# Patient Record
Sex: Male | Born: 1940 | Race: Black or African American | Hispanic: No | Marital: Married | Smoking: Never smoker
Health system: Southern US, Community
[De-identification: ages and names within clinical notes are randomized; demographics above are authoritative.]

## PROBLEM LIST (undated history)

## (undated) DIAGNOSIS — I10 Essential (primary) hypertension: Secondary | ICD-10-CM

## (undated) DIAGNOSIS — M1990 Unspecified osteoarthritis, unspecified site: Secondary | ICD-10-CM

## (undated) DIAGNOSIS — R350 Frequency of micturition: Secondary | ICD-10-CM

## (undated) DIAGNOSIS — C50912 Malignant neoplasm of unspecified site of left female breast: Secondary | ICD-10-CM

## (undated) DIAGNOSIS — I82419 Acute embolism and thrombosis of unspecified femoral vein: Secondary | ICD-10-CM

## (undated) DIAGNOSIS — C50911 Malignant neoplasm of unspecified site of right female breast: Secondary | ICD-10-CM

## (undated) DIAGNOSIS — B1910 Unspecified viral hepatitis B without hepatic coma: Secondary | ICD-10-CM

## (undated) HISTORY — PX: TONSILLECTOMY: SUR1361

## (undated) HISTORY — PX: VARICOSE VEIN SURGERY: SHX832

## (undated) HISTORY — PX: JOINT REPLACEMENT: SHX530

## (undated) HISTORY — PX: TOTAL KNEE ARTHROPLASTY: SHX125

## (undated) HISTORY — PX: TOTAL HIP ARTHROPLASTY: SHX124

## (undated) HISTORY — PX: BACK SURGERY: SHX140

---

## 2004-01-25 DIAGNOSIS — C50911 Malignant neoplasm of unspecified site of right female breast: Secondary | ICD-10-CM

## 2004-01-25 HISTORY — PX: BREAST BIOPSY: SHX20

## 2004-01-25 HISTORY — DX: Malignant neoplasm of unspecified site of right female breast: C50.911

## 2004-01-25 HISTORY — PX: MASTECTOMY: SHX3

## 2013-02-25 ENCOUNTER — Emergency Department (HOSPITAL_COMMUNITY)
Admission: EM | Admit: 2013-02-25 | Discharge: 2013-02-25 | Disposition: A | Discharge status: Home / Self Care | Payer: Medicare Other | Attending: Emergency Medicine | Admitting: Emergency Medicine

## 2013-02-25 ENCOUNTER — Encounter (HOSPITAL_COMMUNITY): Payer: Self-pay | Admitting: Emergency Medicine

## 2013-02-25 ENCOUNTER — Emergency Department (HOSPITAL_COMMUNITY): Payer: Medicare Other

## 2013-02-25 DIAGNOSIS — Z79899 Other long term (current) drug therapy: Secondary | ICD-10-CM | POA: Insufficient documentation

## 2013-02-25 DIAGNOSIS — I10 Essential (primary) hypertension: Secondary | ICD-10-CM | POA: Insufficient documentation

## 2013-02-25 DIAGNOSIS — M19079 Primary osteoarthritis, unspecified ankle and foot: Secondary | ICD-10-CM | POA: Insufficient documentation

## 2013-02-25 DIAGNOSIS — M1990 Unspecified osteoarthritis, unspecified site: Secondary | ICD-10-CM

## 2013-02-25 DIAGNOSIS — Z7982 Long term (current) use of aspirin: Secondary | ICD-10-CM | POA: Insufficient documentation

## 2013-02-25 HISTORY — DX: Essential (primary) hypertension: I10

## 2013-02-25 MED ORDER — DICLOFENAC SODIUM 50 MG PO TBEC: 50.0000 mg | DELAYED_RELEASE_TABLET | Freq: Two times a day (BID) | ORAL | Status: DC

## 2013-02-25 MED ORDER — DICLOFENAC SODIUM 50 MG PO TBEC
50.0000 mg | DELAYED_RELEASE_TABLET | Freq: Once | ORAL | Status: AC
  Administered 2013-02-25: 50 mg via ORAL
  Filled 2013-02-25: qty 1

## 2013-02-25 MED ORDER — HYDROCODONE-ACETAMINOPHEN 5-325 MG PO TABS: 1.0000 | ORAL_TABLET | Freq: Four times a day (QID) | ORAL | Status: DC | PRN

## 2013-02-25 NOTE — ED Provider Notes (Signed)
CSN: [id number]     Arrival date & time 02/25/13  1112 History   First MD Initiated Contact with Patient 02/25/13 1119    This chart was scribed for Earney Navy, a non-physician practitioner working with No att. providers found by Denice Bors, ED Scribe. This patient was seen in room TR07C/TR07C and the patient's care was started at 12:30 PM      Chief Complaint  Patient presents with  . Ankle Pain   (Consider location/radiation/quality/duration/timing/severity/associated sxs/prior Treatment) The history is provided by the patient. No language interpreter was used.   HPI Comments Angel Fox is a 73 y.o. male who presents to the Emergency Department complaining of constant worsening right ankle pain onset 3 weeks. Describes pain as sharp sensation. Reports pain is exacerbated with weight bearing. Reports pain is mildly alleviated at rest. Denies associated recent injury, numbness, weakness, and fever. Denies PMHx of gout.   Past Medical History  Diagnosis Date  . Hypertension    Past Surgical History  Procedure Laterality Date  . Joint replacement     No family history on file. History  Substance Use Topics  . Smoking status: Never Smoker   . Smokeless tobacco: Not on file  . Alcohol Use: No    Review of Systems  Constitutional: Negative for fever.  Musculoskeletal: Positive for arthralgias.  Psychiatric/Behavioral: Negative for confusion.    Allergies  Codeine  Home Medications   Current Outpatient Rx  Name  Route  Sig  Dispense  Refill  . aspirin EC 81 MG tablet   Oral   Take 81 mg by mouth daily.         Marland Kitchen atorvastatin (LIPITOR) 40 MG tablet   Oral   Take 40 mg by mouth daily.         . hydrochlorothiazide (HYDRODIURIL) 25 MG tablet   Oral   Take 25 mg by mouth daily.         Marland Kitchen ibuprofen (ADVIL,MOTRIN) 200 MG tablet   Oral   Take 600 mg by mouth every 6 (six) hours as needed (pain).         . Multiple Vitamin (MULTIVITAMIN WITH  MINERALS) TABS tablet   Oral   Take 1 tablet by mouth daily.         . potassium chloride SA (K-DUR,KLOR-CON) 20 MEQ tablet   Oral   Take 20 mEq by mouth daily.         . propranolol (INDERAL) 40 MG tablet   Oral   Take 40 mg by mouth every morning.         . diclofenac (VOLTAREN) 50 MG EC tablet   Oral   Take 1 tablet (50 mg total) by mouth 2 (two) times daily.   30 tablet   0   . HYDROcodone-acetaminophen (NORCO/VICODIN) 5-325 MG per tablet   Oral   Take 1 tablet by mouth every 6 (six) hours as needed.   20 tablet   0    BP 136/75  Pulse 58  Temp(Src) 98.4 F (36.9 C) (Oral)  Resp 18  SpO2 99% Physical Exam  Nursing note and vitals reviewed. Constitutional: He is oriented to person, place, and time. He appears well-developed and well-nourished. No distress.  HENT:  Head: Normocephalic and atraumatic.  Eyes: EOM are normal.  Neck: Neck supple. No tracheal deviation present.  Cardiovascular: Normal rate.   Pulmonary/Chest: Effort normal. No respiratory distress.  Musculoskeletal: Normal range of motion.       Right  ankle: Tenderness. Lateral malleolus tenderness found. Achilles tendon normal.  Right ankle:  No pitting edema, no erythema, and no ecchymosis  Strong DP and PT pulses.   Normal sensation and cap refill  No pain with ROM of right ankle   Neurological: He is alert and oriented to person, place, and time.  Skin: Skin is warm and dry.  Psychiatric: He has a normal mood and affect. His behavior is normal.    ED Course  Procedures   COORDINATION OF CARE:  Nursing notes reviewed. Vital signs reviewed. Initial pt interview and examination performed.   12:33 PM-Discussed work up plan with pt at bedside, which includes x-ray of right ankle. Pt agrees with plan.  12:38 PM Nursing Notes Reviewed/ Care Coordinated Applicable Imaging Reviewed and incorporated into ED treatment Discussed results and treatment plan with pt. Pt demonstrates  understanding and agrees with plan.  Treatment plan initiated: Medications  diclofenac (VOLTAREN) EC tablet 50 mg (50 mg Oral Given 02/25/13 1318)     Initial diagnostic testing ordered.     Labs Review Labs Reviewed - No data to display Imaging Review Dg Ankle Complete Right  02/25/2013   CLINICAL DATA:  Ankle pain.  No known injury.  EXAM: RIGHT ANKLE - COMPLETE 3+ VIEW  COMPARISON:  None.  FINDINGS: No fracture or bone lesion. The ankle mortise is normally space and aligned with no arthropathic changes evident.  There is a small plantar calcaneal spur.  Bones are demineralized. There is diffuse mild soft tissue swelling.  IMPRESSION: No fracture, bone lesion or ankle joint abnormality.   Electronically Signed   By: Lajean Manes M.D.   On: 02/25/2013 12:01    EKG Interpretation   None       MDM   1. Arthritis    73 y.o.Angel Fox evaluation in the Emergency Department is complete. It has been determined that no acute conditions requiring further emergency intervention are present at this time. The patient/guardian have been advised of the diagnosis and plan. We have discussed signs and symptoms that warrant return to the ED, such as changes or worsening in symptoms.  Vital signs are stable at discharge. Filed Vitals:   02/25/13 1326  BP: 136/75  Pulse: 58  Temp:   Resp: 18    Patient/guardian has voiced understanding and agreed to follow-up with the PCP or specialist.  I personally performed the services described in this documentation, which was scribed in my presence. The recorded information has been reviewed and is accurate.    Linus Mako, PA-C 02/25/13 1431

## 2013-02-25 NOTE — ED Notes (Signed)
Pt reports right ankle pain x 3 weeks, denies known injury.

## 2013-02-25 NOTE — ED Provider Notes (Signed)
Medical screening examination/treatment/procedure(s) were performed by non-physician practitioner and as supervising physician I was immediately available for consultation/collaboration.     Veryl Speak, MD 02/25/13 [phone]

## 2013-02-25 NOTE — ED Notes (Signed)
Message to pharmacy for medicine.

## 2013-02-25 NOTE — Discharge Instructions (Signed)

## 2013-03-18 ENCOUNTER — Emergency Department (HOSPITAL_COMMUNITY)
Admission: EM | Admit: 2013-03-18 | Discharge: 2013-03-18 | Disposition: A | Discharge status: Home / Self Care | Payer: Medicare Other | Attending: Emergency Medicine | Admitting: Emergency Medicine

## 2013-03-18 ENCOUNTER — Encounter (HOSPITAL_COMMUNITY): Payer: Self-pay | Admitting: Emergency Medicine

## 2013-03-18 DIAGNOSIS — M25579 Pain in unspecified ankle and joints of unspecified foot: Secondary | ICD-10-CM

## 2013-03-18 DIAGNOSIS — I10 Essential (primary) hypertension: Secondary | ICD-10-CM | POA: Insufficient documentation

## 2013-03-18 DIAGNOSIS — Z79899 Other long term (current) drug therapy: Secondary | ICD-10-CM | POA: Insufficient documentation

## 2013-03-18 DIAGNOSIS — M25569 Pain in unspecified knee: Secondary | ICD-10-CM | POA: Insufficient documentation

## 2013-03-18 DIAGNOSIS — Z7982 Long term (current) use of aspirin: Secondary | ICD-10-CM | POA: Insufficient documentation

## 2013-03-18 DIAGNOSIS — IMO0001 Reserved for inherently not codable concepts without codable children: Secondary | ICD-10-CM | POA: Insufficient documentation

## 2013-03-18 MED ORDER — HYDROCODONE-ACETAMINOPHEN 5-325 MG PO TABS: 1.0000 | ORAL_TABLET | Freq: Four times a day (QID) | ORAL | Status: DC | PRN

## 2013-03-18 NOTE — ED Provider Notes (Signed)
Medical screening examination/treatment/procedure(s) were performed by non-physician practitioner and as supervising physician I was immediately available for consultation/collaboration.  EKG Interpretation   None         Maudry Diego, MD 03/18/13 [phone]

## 2013-03-18 NOTE — ED Provider Notes (Signed)
CSN: [id number]     Arrival date & time 03/18/13  0927 History  This chart was scribed for non-physician practitioner, Montine Circle, PA-C working with Maudry Diego, MD by Frederich Balding, ED scribe. This patient was seen in room TR06C/TR06C and the patient's care was started at 10:02 AM.   Chief Complaint  Patient presents with  . Leg Pain   The history is provided by the patient. No language interpreter was used.   HPI Comments: Edras Wilford is a 73 y.o. male who presents to the Emergency Department complaining of gradual onset, constant right ankle pain that started 6 weeks ago. Denies fall or injury. Pt was seen for the same one week ago but can't get an appointment at [facility], [facility], [facility] until next month. Pain is worse with ambulation. Pt states his buttocks also hurts when he sits down. He is requesting refills on the medications he was given last time he was here. Pt has a history of right knee replacement.   Past Medical History  Diagnosis Date  . Hypertension    Past Surgical History  Procedure Laterality Date  . Joint replacement     No family history on file. History  Substance Use Topics  . Smoking status: Never Smoker   . Smokeless tobacco: Not on file  . Alcohol Use: No    Review of Systems  Constitutional: Negative for fever.  HENT: Negative for congestion.   Eyes: Negative for redness.  Respiratory: Negative for shortness of breath.   Musculoskeletal: Positive for arthralgias and myalgias.  Skin: Negative for wound.  Neurological: Negative for speech difficulty.  Psychiatric/Behavioral: Negative for confusion.   Allergies  Codeine  Home Medications   Current Outpatient Rx  Name  Route  Sig  Dispense  Refill  . aspirin EC 81 MG tablet   Oral   Take 81 mg by mouth daily.         Marland Kitchen atorvastatin (LIPITOR) 40 MG tablet   Oral   Take 40 mg by mouth daily.         . diclofenac (VOLTAREN) 50 MG EC tablet   Oral   Take 1 tablet (50 mg total) by mouth 2  (two) times daily.   30 tablet   0   . hydrochlorothiazide (HYDRODIURIL) 25 MG tablet   Oral   Take 25 mg by mouth daily.         Marland Kitchen HYDROcodone-acetaminophen (NORCO/VICODIN) 5-325 MG per tablet   Oral   Take 1 tablet by mouth every 6 (six) hours as needed.   20 tablet   0   . ibuprofen (ADVIL,MOTRIN) 200 MG tablet   Oral   Take 600 mg by mouth every 6 (six) hours as needed (pain).         . Multiple Vitamin (MULTIVITAMIN WITH MINERALS) TABS tablet   Oral   Take 1 tablet by mouth daily.         . potassium chloride SA (K-DUR,KLOR-CON) 20 MEQ tablet   Oral   Take 20 mEq by mouth daily.         . propranolol (INDERAL) 40 MG tablet   Oral   Take 40 mg by mouth every morning.          BP 160/91  Pulse 66  Temp(Src) 97.9 F (36.6 C) (Oral)  Resp 18  Ht 5\' 10"  (1.778 m)  Wt 235 lb (106.595 kg)  BMI 33.72 kg/m2  SpO2 100%  Physical Exam  Nursing note and vitals  reviewed. Constitutional: He is oriented to person, place, and time. He appears well-developed. No distress.  HENT:  Head: Normocephalic and atraumatic.  Eyes: Conjunctivae and EOM are normal.  Cardiovascular: Normal rate, regular rhythm and intact distal pulses.   Brisk capillary refill.   Pulmonary/Chest: Effort normal. No stridor. No respiratory distress.  Abdominal: He exhibits no distension.  Musculoskeletal: He exhibits no edema.  Right ankle tender to palpation over the ATFL. No bony abnormality or deformity. No swelling. ROM and strength 5/5. No evidence of DVT or septic joint. No unilateral leg swelling.   Neurological: He is alert and oriented to person, place, and time.  Sensation intact.   Skin: Skin is warm and dry.  No erythema or signs of cellulitis. No wounds or ulcers.   Psychiatric: He has a normal mood and affect.    ED Course  Procedures (including critical care time)  DIAGNOSTIC STUDIES: Oxygen Saturation is 100% on RA, normal by my interpretation.    COORDINATION OF  CARE: 10:06 AM-Discussed treatment plan which includes an ankle brace and a short course of pain medication with pt at bedside and pt agreed to plan.   Labs Review Labs Reviewed - No data to display Imaging Review No results found.  EKG Interpretation   None       MDM   Final diagnoses:  Ankle pain    Patient with chronic ankle pain. He was seen 6 weeks ago for the same. Plain films were negative. He denies any mechanism of injury. No evidence of DVT, cellulitis, or septic joint. I will prescribe Norco, and recommend orthopedic/PCP followup. I will also give ankle brace.  I personally performed the services described in this documentation, which was scribed in my presence. The recorded information has been reviewed and is accurate.    Montine Circle, PA-C 03/18/13 1014

## 2013-03-18 NOTE — ED Notes (Signed)
Patient states he has been having R leg pain x 6 weeks.  Patient was just here last week for same, but states he could not get appointment until next month at [facility].

## 2013-03-18 NOTE — Discharge Instructions (Signed)
Arthralgia  Your caregiver has diagnosed you as suffering from an arthralgia. Arthralgia means there is pain in a joint. This can come from many reasons including:  · Bruising the joint which causes soreness (inflammation) in the joint.  · Wear and tear on the joints which occur as we grow older (osteoarthritis).  · Overusing the joint.  · Various forms of arthritis.  · Infections of the joint.  Regardless of the cause of pain in your joint, most of these different pains respond to anti-inflammatory drugs and rest. The exception to this is when a joint is infected, and these cases are treated with antibiotics, if it is a bacterial infection.  HOME CARE INSTRUCTIONS   · Rest the injured area for as long as directed by your caregiver. Then slowly start using the joint as directed by your caregiver and as the pain allows. Crutches as directed may be useful if the ankles, knees or hips are involved. If the knee was splinted or casted, continue use and care as directed. If an stretchy or elastic wrapping bandage has been applied today, it should be removed and re-applied every 3 to 4 hours. It should not be applied tightly, but firmly enough to keep swelling down. Watch toes and feet for swelling, bluish discoloration, coldness, numbness or excessive pain. If any of these problems (symptoms) occur, remove the ace bandage and re-apply more loosely. If these symptoms persist, contact your caregiver or return to this location.  · For the first 24 hours, keep the injured extremity elevated on pillows while lying down.  · Apply ice for 15-20 minutes to the sore joint every couple hours while awake for the first half day. Then 03-04 times per day for the first 48 hours. Put the ice in a plastic bag and place a towel between the bag of ice and your skin.  · Wear any splinting, casting, elastic bandage applications, or slings as instructed.  · Only take over-the-counter or prescription medicines for pain, discomfort, or fever as  directed by your caregiver. Do not use aspirin immediately after the injury unless instructed by your physician. Aspirin can cause increased bleeding and bruising of the tissues.  · If you were given crutches, continue to use them as instructed and do not resume weight bearing on the sore joint until instructed.  Persistent pain and inability to use the sore joint as directed for more than 2 to 3 days are warning signs indicating that you should see a caregiver for a follow-up visit as soon as possible. Initially, a hairline fracture (break in bone) may not be evident on X-rays. Persistent pain and swelling indicate that further evaluation, non-weight bearing or use of the joint (use of crutches or slings as instructed), or further X-rays are indicated. X-rays may sometimes not show a small fracture until a week or 10 days later. Make a follow-up appointment with your own caregiver or one to whom we have referred you. A radiologist (specialist in reading X-rays) may read your X-rays. Make sure you know how you are to obtain your X-ray results. Do not assume everything is normal if you do not hear from us.  SEEK MEDICAL CARE IF:  Bruising, swelling, or pain increases.  SEEK IMMEDIATE MEDICAL CARE IF:   · Your fingers or toes are numb or blue.  · The pain is not responding to medications and continues to stay the same or get worse.  · The pain in your joint becomes severe.  · You   develop a fever over 102° F (38.9° C).  · It becomes impossible to move or use the joint.  MAKE SURE YOU:   · Understand these instructions.  · Will watch your condition.  · Will get help right away if you are not doing well or get worse.  Document Released: 01/10/2005 Document Revised: 04/04/2011 Document Reviewed: 08/29/2007  [employer]® Patient Information ©2014 [employer].

## 2014-08-04 ENCOUNTER — Other Ambulatory Visit: Payer: Self-pay | Admitting: Neurosurgery

## 2014-08-12 ENCOUNTER — Encounter (HOSPITAL_COMMUNITY)
Admission: RE | Admit: 2014-08-12 | Discharge: 2014-08-12 | Disposition: A | Discharge status: Home / Self Care | Payer: Medicare Other | Source: Ambulatory Visit | Attending: Neurosurgery | Admitting: Neurosurgery

## 2014-08-12 ENCOUNTER — Encounter (HOSPITAL_COMMUNITY): Payer: Self-pay

## 2014-08-12 DIAGNOSIS — M5126 Other intervertebral disc displacement, lumbar region: Secondary | ICD-10-CM | POA: Diagnosis not present

## 2014-08-12 DIAGNOSIS — Z01812 Encounter for preprocedural laboratory examination: Secondary | ICD-10-CM | POA: Insufficient documentation

## 2014-08-12 DIAGNOSIS — R001 Bradycardia, unspecified: Secondary | ICD-10-CM | POA: Insufficient documentation

## 2014-08-12 DIAGNOSIS — Z01818 Encounter for other preprocedural examination: Secondary | ICD-10-CM | POA: Diagnosis present

## 2014-08-12 DIAGNOSIS — I440 Atrioventricular block, first degree: Secondary | ICD-10-CM | POA: Insufficient documentation

## 2014-08-12 HISTORY — DX: Frequency of micturition: R35.0

## 2014-08-12 LAB — CBC
HEMATOCRIT: 40 % (ref 39.0–52.0)
HEMOGLOBIN: 13.6 g/dL (ref 13.0–17.0)
MCH: 30.2 pg (ref 26.0–34.0)
MCHC: 34 g/dL (ref 30.0–36.0)
MCV: 88.9 fL (ref 78.0–100.0)
Platelets: 144 10*3/uL — ABNORMAL LOW (ref 150–400)
RBC: 4.5 MIL/uL (ref 4.22–5.81)
RDW: 14.8 % (ref 11.5–15.5)
WBC: 7.5 10*3/uL (ref 4.0–10.5)

## 2014-08-12 LAB — COMPREHENSIVE METABOLIC PANEL
ALK PHOS: 87 U/L (ref 38–126)
ALT: 24 U/L (ref 17–63)
ANION GAP: 6 (ref 5–15)
AST: 30 U/L (ref 15–41)
Albumin: 3.9 g/dL (ref 3.5–5.0)
BILIRUBIN TOTAL: 0.7 mg/dL (ref 0.3–1.2)
BUN: 12 mg/dL (ref 6–20)
CHLORIDE: 106 mmol/L (ref 101–111)
CO2: 24 mmol/L (ref 22–32)
Calcium: 9.2 mg/dL (ref 8.9–10.3)
Creatinine, Ser: 1.19 mg/dL (ref 0.61–1.24)
GFR calc non Af Amer: 58 mL/min — ABNORMAL LOW (ref >60)
Glucose, Bld: 91 mg/dL (ref 65–99)
Potassium: 4.2 mmol/L (ref 3.5–5.1)
Sodium: 136 mmol/L (ref 135–145)
TOTAL PROTEIN: 6.9 g/dL (ref 6.5–8.1)

## 2014-08-12 LAB — SURGICAL PCR SCREEN
MRSA, PCR: NEGATIVE
Staphylococcus aureus: NEGATIVE

## 2014-08-12 NOTE — Pre-Procedure Instructions (Signed)
    [name]  08/12/2014      [address] [place] [address] Phone: [phone] Fax: [phone]  [id number] [address], [address] [place] [address] Phone: [phone] Fax: [phone]    Your procedure is scheduled on 08-18-2014  Monday   Report to [facility] Admitting at 5:30 A.M.   Call this number if you have problems the morning of surgery:  [phone]   Remember:  Do not eat food or drink liquids after midnight.   Take these medicines the morning of surgery with A SIP OF WATER potassium chloride(K-Dur),Propranolol(Inderal),Tamoxifen( Nolvadex)   Do not wear jewelry,   Do not wear lotions, powders, or perfumes.    Do not shave 48 hours prior to surgery.  Men may shave face and neck .  Do not bring valuables to the hospital.  [facility] is not responsible for any belongings or valuables.  Contacts, dentures or bridgework may not be worn into surgery.  Leave your suitcase in the car.  After surgery it may be brought to your room.  For patients admitted to the hospital, discharge time will be determined by your treatment team.     Special instructions:  See Attached sheet "Preparing for Surgery" for instructions on CHG shower    Please read over the following fact sheets that you were given. Pain Booklet and Surgical Site Infection Prevention

## 2014-08-12 NOTE — Progress Notes (Signed)
Pt. Denies any cardiac testing.

## 2014-08-18 ENCOUNTER — Encounter (HOSPITAL_COMMUNITY)
Admission: RE | Disposition: A | Discharge status: Home / Self Care | Payer: Self-pay | Source: Ambulatory Visit | Attending: Neurosurgery

## 2014-08-18 ENCOUNTER — Ambulatory Visit (HOSPITAL_COMMUNITY): Payer: Medicare Other | Admitting: Certified Registered Nurse Anesthetist

## 2014-08-18 ENCOUNTER — Inpatient Hospital Stay (HOSPITAL_COMMUNITY): Payer: Medicare Other

## 2014-08-18 ENCOUNTER — Encounter (HOSPITAL_COMMUNITY): Payer: Self-pay | Admitting: General Practice

## 2014-08-18 ENCOUNTER — Inpatient Hospital Stay (HOSPITAL_COMMUNITY)
Admission: RE | Admit: 2014-08-18 | Discharge: 2014-08-19 | DRG: 520 | Disposition: A | Discharge status: Home / Self Care | Payer: Medicare Other | Source: Ambulatory Visit | Attending: Neurosurgery | Admitting: Neurosurgery

## 2014-08-18 ENCOUNTER — Ambulatory Visit (HOSPITAL_COMMUNITY): Payer: Medicare Other

## 2014-08-18 DIAGNOSIS — Z79899 Other long term (current) drug therapy: Secondary | ICD-10-CM

## 2014-08-18 DIAGNOSIS — M5126 Other intervertebral disc displacement, lumbar region: Principal | ICD-10-CM | POA: Diagnosis present

## 2014-08-18 DIAGNOSIS — Z6834 Body mass index (BMI) 34.0-34.9, adult: Secondary | ICD-10-CM

## 2014-08-18 DIAGNOSIS — Z452 Encounter for adjustment and management of vascular access device: Secondary | ICD-10-CM

## 2014-08-18 DIAGNOSIS — Z7982 Long term (current) use of aspirin: Secondary | ICD-10-CM | POA: Diagnosis not present

## 2014-08-18 DIAGNOSIS — E6609 Other obesity due to excess calories: Secondary | ICD-10-CM | POA: Diagnosis present

## 2014-08-18 DIAGNOSIS — I10 Essential (primary) hypertension: Secondary | ICD-10-CM | POA: Diagnosis present

## 2014-08-18 DIAGNOSIS — M4806 Spinal stenosis, lumbar region: Secondary | ICD-10-CM | POA: Diagnosis present

## 2014-08-18 DIAGNOSIS — Z419 Encounter for procedure for purposes other than remedying health state, unspecified: Secondary | ICD-10-CM

## 2014-08-18 HISTORY — PX: LUMBAR LAMINECTOMY/DECOMPRESSION MICRODISCECTOMY: SHX5026

## 2014-08-18 SURGERY — LUMBAR LAMINECTOMY/DECOMPRESSION MICRODISCECTOMY 2 LEVELS
Anesthesia: General | Site: Spine Lumbar | Laterality: Right

## 2014-08-18 MED ORDER — ASPIRIN EC 81 MG PO TBEC
81.0000 mg | DELAYED_RELEASE_TABLET | Freq: Every day | ORAL | Status: DC
  Administered 2014-08-18 – 2014-08-19 (×2): 81 mg via ORAL
  Filled 2014-08-18 (×2): qty 1

## 2014-08-18 MED ORDER — MIDAZOLAM HCL 5 MG/5ML IJ SOLN
INTRAMUSCULAR | Status: DC | PRN
  Administered 2014-08-18 (×2): 1 mg via INTRAVENOUS

## 2014-08-18 MED ORDER — STERILE WATER FOR INJECTION IJ SOLN
INTRAMUSCULAR | Status: AC
  Filled 2014-08-18: qty 20

## 2014-08-18 MED ORDER — ONDANSETRON HCL 4 MG/2ML IJ SOLN
4.0000 mg | INTRAMUSCULAR | Status: DC | PRN
  Administered 2014-08-18: 4 mg via INTRAVENOUS
  Filled 2014-08-18: qty 2

## 2014-08-18 MED ORDER — ACETAMINOPHEN 325 MG PO TABS: 650.0000 mg | ORAL_TABLET | ORAL | Status: DC | PRN

## 2014-08-18 MED ORDER — SODIUM CHLORIDE 0.9 % IJ SOLN: 3.0000 mL | INTRAMUSCULAR | Status: DC | PRN

## 2014-08-18 MED ORDER — PROPRANOLOL HCL 40 MG PO TABS
40.0000 mg | ORAL_TABLET | Freq: Every morning | ORAL | Status: DC
  Administered 2014-08-19: 40 mg via ORAL
  Filled 2014-08-18: qty 1

## 2014-08-18 MED ORDER — VECURONIUM BROMIDE 10 MG IV SOLR
INTRAVENOUS | Status: AC
  Filled 2014-08-18: qty 10

## 2014-08-18 MED ORDER — MENTHOL 3 MG MT LOZG
1.0000 | LOZENGE | OROMUCOSAL | Status: DC | PRN
  Filled 2014-08-18: qty 9

## 2014-08-18 MED ORDER — TRAMADOL HCL 50 MG PO TABS
50.0000 mg | ORAL_TABLET | Freq: Four times a day (QID) | ORAL | Status: DC | PRN
  Administered 2014-08-18 – 2014-08-19 (×4): 50 mg via ORAL
  Filled 2014-08-18 (×5): qty 1

## 2014-08-18 MED ORDER — EPHEDRINE SULFATE 50 MG/ML IJ SOLN
INTRAMUSCULAR | Status: DC | PRN
  Administered 2014-08-18 (×2): 5 mg via INTRAVENOUS

## 2014-08-18 MED ORDER — ROCURONIUM BROMIDE 100 MG/10ML IV SOLN
INTRAVENOUS | Status: DC | PRN
  Administered 2014-08-18: 50 mg via INTRAVENOUS

## 2014-08-18 MED ORDER — ONDANSETRON HCL 4 MG/2ML IJ SOLN
INTRAMUSCULAR | Status: DC | PRN
  Administered 2014-08-18: 4 mg via INTRAVENOUS

## 2014-08-18 MED ORDER — SUGAMMADEX SODIUM 200 MG/2ML IV SOLN
INTRAVENOUS | Status: AC
  Filled 2014-08-18: qty 2

## 2014-08-18 MED ORDER — NEOSTIGMINE METHYLSULFATE 10 MG/10ML IV SOLN
INTRAVENOUS | Status: DC | PRN
  Administered 2014-08-18: 2 mg via INTRAVENOUS

## 2014-08-18 MED ORDER — HYDROCHLOROTHIAZIDE 25 MG PO TABS
25.0000 mg | ORAL_TABLET | Freq: Every day | ORAL | Status: DC
  Administered 2014-08-18 – 2014-08-19 (×2): 25 mg via ORAL
  Filled 2014-08-18 (×2): qty 1

## 2014-08-18 MED ORDER — PROPOFOL 10 MG/ML IV BOLUS
INTRAVENOUS | Status: DC | PRN
  Administered 2014-08-18: 150 mg via INTRAVENOUS

## 2014-08-18 MED ORDER — FENTANYL CITRATE (PF) 250 MCG/5ML IJ SOLN
INTRAMUSCULAR | Status: AC
  Filled 2014-08-18: qty 5

## 2014-08-18 MED ORDER — SENNOSIDES-DOCUSATE SODIUM 8.6-50 MG PO TABS
1.0000 | ORAL_TABLET | Freq: Every evening | ORAL | Status: DC | PRN
  Filled 2014-08-18: qty 1

## 2014-08-18 MED ORDER — HEMOSTATIC AGENTS (NO CHARGE) OPTIME
TOPICAL | Status: DC | PRN
  Administered 2014-08-18: 1 via TOPICAL

## 2014-08-18 MED ORDER — BISACODYL 5 MG PO TBEC
5.0000 mg | DELAYED_RELEASE_TABLET | Freq: Every day | ORAL | Status: DC | PRN
  Filled 2014-08-18: qty 1

## 2014-08-18 MED ORDER — ADULT MULTIVITAMIN W/MINERALS CH
1.0000 | ORAL_TABLET | Freq: Every day | ORAL | Status: DC
  Administered 2014-08-18 – 2014-08-19 (×2): 1 via ORAL
  Filled 2014-08-18 (×2): qty 1

## 2014-08-18 MED ORDER — VECURONIUM BROMIDE 10 MG IV SOLR
INTRAVENOUS | Status: DC | PRN
  Administered 2014-08-18: 2 mg via INTRAVENOUS

## 2014-08-18 MED ORDER — 0.9 % SODIUM CHLORIDE (POUR BTL) OPTIME
TOPICAL | Status: DC | PRN
  Administered 2014-08-18: 1000 mL

## 2014-08-18 MED ORDER — LIDOCAINE HCL (CARDIAC) 20 MG/ML IV SOLN
INTRAVENOUS | Status: AC
  Filled 2014-08-18: qty 5

## 2014-08-18 MED ORDER — LACTATED RINGERS IV SOLN
INTRAVENOUS | Status: DC | PRN
  Administered 2014-08-18 (×2): via INTRAVENOUS

## 2014-08-18 MED ORDER — LIDOCAINE-EPINEPHRINE 0.5 %-1:200000 IJ SOLN
INTRAMUSCULAR | Status: DC | PRN
  Administered 2014-08-18: 10 mL

## 2014-08-18 MED ORDER — POTASSIUM CHLORIDE IN NACL 20-0.9 MEQ/L-% IV SOLN
INTRAVENOUS | Status: DC
  Filled 2014-08-18 (×4): qty 1000

## 2014-08-18 MED ORDER — HYDROMORPHONE HCL 1 MG/ML IJ SOLN
INTRAMUSCULAR | Status: AC
  Administered 2014-08-18: 0.5 mg via INTRAVENOUS
  Filled 2014-08-18: qty 1

## 2014-08-18 MED ORDER — MIDAZOLAM HCL 2 MG/2ML IJ SOLN
INTRAMUSCULAR | Status: AC
  Filled 2014-08-18: qty 2

## 2014-08-18 MED ORDER — ROCURONIUM BROMIDE 50 MG/5ML IV SOLN
INTRAVENOUS | Status: AC
  Filled 2014-08-18: qty 1

## 2014-08-18 MED ORDER — GLYCOPYRROLATE 0.2 MG/ML IJ SOLN
INTRAMUSCULAR | Status: DC | PRN
Start: 2014-08-18 — End: 2014-08-18
  Administered 2014-08-18: 0.3 mg via INTRAVENOUS

## 2014-08-18 MED ORDER — HYDROMORPHONE HCL 1 MG/ML IJ SOLN
INTRAMUSCULAR | Status: AC
  Filled 2014-08-18: qty 1

## 2014-08-18 MED ORDER — ONDANSETRON HCL 4 MG/2ML IJ SOLN
INTRAMUSCULAR | Status: AC
  Filled 2014-08-18: qty 2

## 2014-08-18 MED ORDER — ONDANSETRON HCL 4 MG/2ML IJ SOLN: 4.0000 mg | Freq: Once | INTRAMUSCULAR | Status: DC | PRN

## 2014-08-18 MED ORDER — SODIUM CHLORIDE 0.9 % IJ SOLN
3.0000 mL | Freq: Two times a day (BID) | INTRAMUSCULAR | Status: DC
  Administered 2014-08-18: 3 mL via INTRAVENOUS

## 2014-08-18 MED ORDER — THROMBIN 5000 UNITS EX SOLR
CUTANEOUS | Status: DC | PRN
  Administered 2014-08-18 (×2): 5000 [IU] via TOPICAL

## 2014-08-18 MED ORDER — MEPERIDINE HCL 25 MG/ML IJ SOLN: 6.2500 mg | INTRAMUSCULAR | Status: DC | PRN

## 2014-08-18 MED ORDER — TAMSULOSIN HCL 0.4 MG PO CAPS
0.4000 mg | ORAL_CAPSULE | Freq: Every day | ORAL | Status: DC
  Administered 2014-08-18 – 2014-08-19 (×2): 0.4 mg via ORAL
  Filled 2014-08-18 (×2): qty 1

## 2014-08-18 MED ORDER — PHENOL 1.4 % MT LIQD: 1.0000 | OROMUCOSAL | Status: DC | PRN

## 2014-08-18 MED ORDER — DIAZEPAM 5 MG PO TABS
5.0000 mg | ORAL_TABLET | Freq: Four times a day (QID) | ORAL | Status: DC | PRN
  Administered 2014-08-18 – 2014-08-19 (×4): 5 mg via ORAL
  Filled 2014-08-18 (×4): qty 1

## 2014-08-18 MED ORDER — SODIUM CHLORIDE 0.9 % IV SOLN: 250.0000 mL | INTRAVENOUS | Status: DC

## 2014-08-18 MED ORDER — CEFAZOLIN SODIUM-DEXTROSE 2-3 GM-% IV SOLR
INTRAVENOUS | Status: AC
  Filled 2014-08-18: qty 50

## 2014-08-18 MED ORDER — CEFAZOLIN SODIUM-DEXTROSE 2-3 GM-% IV SOLR
2.0000 g | INTRAVENOUS | Status: AC
  Administered 2014-08-18: 2 g via INTRAVENOUS

## 2014-08-18 MED ORDER — PROPOFOL 10 MG/ML IV BOLUS
INTRAVENOUS | Status: AC
  Filled 2014-08-18: qty 20

## 2014-08-18 MED ORDER — TAMOXIFEN CITRATE 10 MG PO TABS
20.0000 mg | ORAL_TABLET | Freq: Every day | ORAL | Status: DC
  Administered 2014-08-18 – 2014-08-19 (×2): 20 mg via ORAL
  Filled 2014-08-18 (×2): qty 2

## 2014-08-18 MED ORDER — FENTANYL CITRATE (PF) 100 MCG/2ML IJ SOLN
INTRAMUSCULAR | Status: DC | PRN
  Administered 2014-08-18: 100 ug via INTRAVENOUS
  Administered 2014-08-18 (×3): 50 ug via INTRAVENOUS

## 2014-08-18 MED ORDER — HYDROMORPHONE HCL 1 MG/ML IJ SOLN
0.2500 mg | INTRAMUSCULAR | Status: DC | PRN
  Administered 2014-08-18 (×3): 0.5 mg via INTRAVENOUS

## 2014-08-18 MED ORDER — ATORVASTATIN CALCIUM 40 MG PO TABS
40.0000 mg | ORAL_TABLET | Freq: Every day | ORAL | Status: DC
  Administered 2014-08-18 – 2014-08-19 (×2): 40 mg via ORAL
  Filled 2014-08-18 (×2): qty 1

## 2014-08-18 MED ORDER — ACETAMINOPHEN 650 MG RE SUPP: 650.0000 mg | RECTAL | Status: DC | PRN

## 2014-08-18 MED ORDER — POTASSIUM CHLORIDE CRYS ER 20 MEQ PO TBCR
20.0000 meq | EXTENDED_RELEASE_TABLET | Freq: Every day | ORAL | Status: DC
  Administered 2014-08-19: 20 meq via ORAL
  Filled 2014-08-18 (×2): qty 1

## 2014-08-18 MED ORDER — KETOROLAC TROMETHAMINE 30 MG/ML IJ SOLN
30.0000 mg | Freq: Four times a day (QID) | INTRAMUSCULAR | Status: DC
  Administered 2014-08-18 – 2014-08-19 (×3): 30 mg via INTRAVENOUS
  Filled 2014-08-18 (×7): qty 1

## 2014-08-18 MED ORDER — DOCUSATE SODIUM 100 MG PO CAPS
100.0000 mg | ORAL_CAPSULE | Freq: Two times a day (BID) | ORAL | Status: DC
  Administered 2014-08-18 – 2014-08-19 (×3): 100 mg via ORAL
  Filled 2014-08-18 (×3): qty 1

## 2014-08-18 SURGICAL SUPPLY — 51 items
BAG DECANTER FOR FLEXI CONT (MISCELLANEOUS) ×3 IMPLANT
BENZOIN TINCTURE PRP APPL 2/3 (GAUZE/BANDAGES/DRESSINGS) IMPLANT
BLADE CLIPPER SURG (BLADE) ×3 IMPLANT
BUR MATCHSTICK NEURO 3.0 LAGG (BURR) ×3 IMPLANT
CANISTER SUCT 3000ML PPV (MISCELLANEOUS) ×3 IMPLANT
CLOSURE WOUND 1/2 X4 (GAUZE/BANDAGES/DRESSINGS)
CONT SPEC 4OZ CLIKSEAL STRL BL (MISCELLANEOUS) ×3 IMPLANT
DECANTER SPIKE VIAL GLASS SM (MISCELLANEOUS) ×3 IMPLANT
DERMABOND ADVANCED (GAUZE/BANDAGES/DRESSINGS) ×2
DERMABOND ADVANCED .7 DNX12 (GAUZE/BANDAGES/DRESSINGS) ×1 IMPLANT
DRAPE LAPAROTOMY 100X72X124 (DRAPES) ×3 IMPLANT
DRAPE MICROSCOPE LEICA (MISCELLANEOUS) ×3 IMPLANT
DRAPE POUCH INSTRU U-SHP 10X18 (DRAPES) ×3 IMPLANT
DRAPE SURG 17X23 STRL (DRAPES) ×3 IMPLANT
DURAPREP 26ML APPLICATOR (WOUND CARE) ×3 IMPLANT
ELECT REM PT RETURN 9FT ADLT (ELECTROSURGICAL) ×3
ELECTRODE REM PT RTRN 9FT ADLT (ELECTROSURGICAL) ×1 IMPLANT
GAUZE SPONGE 4X4 12PLY STRL (GAUZE/BANDAGES/DRESSINGS) IMPLANT
GAUZE SPONGE 4X4 16PLY XRAY LF (GAUZE/BANDAGES/DRESSINGS) IMPLANT
GLOVE ECLIPSE 6.5 STRL STRAW (GLOVE) ×3 IMPLANT
GLOVE ECLIPSE 9.0 STRL (GLOVE) ×6 IMPLANT
GLOVE EXAM NITRILE LRG STRL (GLOVE) IMPLANT
GLOVE EXAM NITRILE MD LF STRL (GLOVE) IMPLANT
GLOVE EXAM NITRILE XL STR (GLOVE) IMPLANT
GLOVE EXAM NITRILE XS STR PU (GLOVE) IMPLANT
GLOVE SURG SS PI 7.5 STRL IVOR (GLOVE) ×6 IMPLANT
GOWN STRL REUS W/ TWL LRG LVL3 (GOWN DISPOSABLE) ×2 IMPLANT
GOWN STRL REUS W/ TWL XL LVL3 (GOWN DISPOSABLE) ×2 IMPLANT
GOWN STRL REUS W/TWL 2XL LVL3 (GOWN DISPOSABLE) IMPLANT
GOWN STRL REUS W/TWL LRG LVL3 (GOWN DISPOSABLE) ×4
GOWN STRL REUS W/TWL XL LVL3 (GOWN DISPOSABLE) ×4
KIT BASIN OR (CUSTOM PROCEDURE TRAY) ×3 IMPLANT
KIT ROOM TURNOVER OR (KITS) ×3 IMPLANT
LIQUID BAND (GAUZE/BANDAGES/DRESSINGS) ×3 IMPLANT
NEEDLE HYPO 25X1 1.5 SAFETY (NEEDLE) ×3 IMPLANT
NEEDLE SPNL 18GX3.5 QUINCKE PK (NEEDLE) ×3 IMPLANT
NS IRRIG 1000ML POUR BTL (IV SOLUTION) ×3 IMPLANT
PACK LAMINECTOMY NEURO (CUSTOM PROCEDURE TRAY) ×3 IMPLANT
PAD ARMBOARD 7.5X6 YLW CONV (MISCELLANEOUS) ×9 IMPLANT
RUBBERBAND STERILE (MISCELLANEOUS) ×6 IMPLANT
SPONGE LAP 4X18 X RAY DECT (DISPOSABLE) IMPLANT
SPONGE SURGIFOAM ABS GEL SZ50 (HEMOSTASIS) ×3 IMPLANT
STRIP CLOSURE SKIN 1/2X4 (GAUZE/BANDAGES/DRESSINGS) IMPLANT
SUT VIC AB 0 CT1 18XCR BRD8 (SUTURE) ×1 IMPLANT
SUT VIC AB 0 CT1 8-18 (SUTURE) ×2
SUT VIC AB 2-0 CT1 18 (SUTURE) ×3 IMPLANT
SUT VIC AB 3-0 SH 8-18 (SUTURE) ×3 IMPLANT
SYR 20ML ECCENTRIC (SYRINGE) ×3 IMPLANT
TOWEL OR 17X24 6PK STRL BLUE (TOWEL DISPOSABLE) ×3 IMPLANT
TOWEL OR 17X26 10 PK STRL BLUE (TOWEL DISPOSABLE) ×3 IMPLANT
WATER STERILE IRR 1000ML POUR (IV SOLUTION) ×3 IMPLANT

## 2014-08-18 NOTE — Op Note (Signed)
  08/18/2014  10:02 AM  PATIENT:  Angel Fox  73 y.o. 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 Koontz was taken to the operating room, intubated and placed under a general anesthetic without difficulty. He was positioned prone on a Wilson frame with all pressure points padded. His back was prepped and draped in a sterile manner. I opened the skin with a 10 blade and carried the dissection down to the thoracolumbar fascia. I used both sharp dissection and the monopolar cautery to expose the lamina of L4,5, and S1. I confirmed my location with an intraoperative xray.  I used the drill, Kerrison punches, and curettes to perform a semihemilaminectomy of L4, and L5. I used the punches to remove the ligamentum flavum to expose the thecal sac at both levels. At L4/5 I performed a foraminotomy and decompressed the spinal canal and L5 roots I brought the microscope into the operative field and with Dr.Pool's assistance we started our decompression of the spinal canal, thecal sac and L4, 5, and S1 root(s). I cauterized epidural veins overlying the disc space then divided them sharply at L5/S1. I opened the disc space with a 15 blade and proceeded with the discectomy. I used pituitary rongeurs, curettes, and other instruments to remove disc material. After the discectomy was completed we inspected  the S1 nerve root and felt it was well decompressed. I explored rostrally, laterally, medially, and caudally and was satisfied with the decompression at both levels. I irrigated the wound, then closed in layers. I approximated the thoracolumbar fascia, subcutaneous, and subcuticular planes with vicryl sutures. I used dermabond for a sterile dressing.   PLAN OF CARE: Admit to inpatient   PATIENT DISPOSITION:  PACU - hemodynamically stable.   Delay start of Pharmacological VTE agent (>24hrs) due to surgical blood loss or risk of bleeding:  yes

## 2014-08-18 NOTE — H&P (Signed)
  BP 173/71 mmHg  Pulse 66  Temp(Src) 98 F (36.7 C) (Oral)  Ht 5\' 10"  (1.778 m)  Wt 108.041 kg (238 lb 3 oz)  BMI 34.18 kg/m2  SpO2 100%   Angel Fox returned today. 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 He was hopping and had severe pain in the right lower extremity. He comes back now a year out having undergone multiple injections and he says that he is not doing any better. He says his leg became quite edematous after the injections and he would never do it again and that it just did not help.       PHYSICAL EXAMINATION:                    On exam today he has a markedly antalgic gait and is still hopping. He has a great deal of difficulty taking a 14 inch step with his right lower extremity, none on the left. Reflex not elicited at the right knee, 1+ at the right ankle, 2+ left knee, 2+ left ankle. He has normal strength in his left lower extremity, some weakness in the right hip flexors. Quads, hamstrings, dorsiflexors, and plantar flexors seem quite normal and 5/5.        Angel Fox returns today with a new MRI.  It still shows foraminal narrowing on the right side at 4-5 and a disk and an osteophyte along with foraminal narrowing on the right side at 5-1.      IMPRESSION/PLAN:                             I do believe he needs to have the disk removed at 5-1 and a decompression at 4-5 eccentric to the right side.      Angel Fox has had this pain now for quite some time.  He has not had any relief from previous treatment and a very long trial of conservative treatment.  I do believe at this point that surgery is his best option.  There is never a guarantee.  The risk and benefits, bleeding, infection, no relief, and need for further surgery were discussed.  Given this, he would like to proceed.  We discussed this for 18 minutes in the office.  He does  understand and wishes to proceed.

## 2014-08-18 NOTE — Anesthesia Procedure Notes (Addendum)
Procedure Name: Intubation Performed by: Judeth Cornfield T Pre-anesthesia Checklist: Patient identified, Emergency Drugs available, Suction available, Patient being monitored and Timeout performed Patient Re-evaluated:Patient Re-evaluated prior to inductionOxygen Delivery Method: Circle system utilized Preoxygenation: Pre-oxygenation with 100% oxygen Intubation Type: IV induction Ventilation: Mask ventilation without difficulty and Oral airway inserted - appropriate to patient size Laryngoscope Size: Mac and 3 Grade View: Grade I Tube type: Oral Tube size: 7.5 mm Number of attempts: 1 Airway Equipment and Method: Stylet Placement Confirmation: ETT inserted through vocal cords under direct vision,  breath sounds checked- equal and bilateral and positive ETCO2 Secured at: 22 cm Tube secured with: Tape Dental Injury: Teeth and Oropharynx as per pre-operative assessment

## 2014-08-18 NOTE — Transfer of Care (Signed)
Immediate Anesthesia Transfer of Care Note  Patient: Angel Fox  Procedure(s) Performed: Procedure(s): RIGHT LUMBAR FOUR-FIVE, LUMBAR  FIVE-SACRAL ONE LAMINECTOMY/DECOMPRESSION MICRODISCECTOMY  (Right)  Patient Location: PACU  Anesthesia Type:General  Level of Consciousness: patient cooperative and responds to stimulation  Airway & Oxygen Therapy: Patient Spontanous Breathing and Patient connected to nasal cannula oxygen  Post-op Assessment: Report given to RN, Post -op Vital signs reviewed and stable and Patient moving all extremities X 4  Post vital signs: Reviewed and stable  Last Vitals:  Filed Vitals:   08/18/14 0956  BP:   Pulse:   Temp: 16.1 C    Complications: No apparent anesthesia complications

## 2014-08-18 NOTE — Anesthesia Preprocedure Evaluation (Addendum)
Anesthesia Evaluation  Patient identified by MRN, date of birth, ID band Patient awake    Reviewed: Allergy & Precautions, NPO status , Patient's Chart, lab work & pertinent test results  Airway Mallampati: I  TM Distance: >3 FB Neck ROM: Full    Dental  (+) Edentulous Upper, Edentulous Lower   Pulmonary neg pulmonary ROS,  breath sounds clear to auscultation  Pulmonary exam normal       Cardiovascular hypertension, Pt. on medications Normal cardiovascular examRhythm:Regular Rate:Normal     Neuro/Psych negative neurological ROS     GI/Hepatic negative GI ROS,   Endo/Other  negative endocrine ROS  Renal/GU negative Renal ROS     Musculoskeletal   Abdominal (+) + obese,   Peds  Hematology negative hematology ROS (+)   Anesthesia Other Findings   Reproductive/Obstetrics                            Anesthesia Physical Anesthesia Plan  ASA: II  Anesthesia Plan: General   Post-op Pain Management:    Induction: Intravenous  Airway Management Planned: Oral ETT  Additional Equipment:   Intra-op Plan:   Post-operative Plan: Extubation in OR  Informed Consent: I have reviewed the patients History and Physical, chart, labs and discussed the procedure including the risks, benefits and alternatives for the proposed anesthesia with the patient or authorized representative who has indicated his/her understanding and acceptance.   Dental advisory given  Plan Discussed with: Surgeon and CRNA  Anesthesia Plan Comments:        Anesthesia Quick Evaluation

## 2014-08-18 NOTE — Anesthesia Postprocedure Evaluation (Signed)
Anesthesia Post Note  Patient: Angel Fox  Procedure(s) Performed: Procedure(s) (LRB): RIGHT LUMBAR FOUR-FIVE, LUMBAR  FIVE-SACRAL ONE LAMINECTOMY/DECOMPRESSION MICRODISCECTOMY  (Right)  Anesthesia type: general  Patient location: PACU  Post pain: Pain level controlled  Post assessment: Patient's Cardiovascular Status Stable  Last Vitals:  Filed Vitals:   08/18/14 1126  BP: 177/83  Pulse: 55  Temp: 36.5 C  Resp: 16    Post vital signs: Reviewed and stable  Level of consciousness: sedated  Complications: No apparent anesthesia complications

## 2014-08-18 NOTE — Progress Notes (Signed)
Utilization review completed.  

## 2014-08-19 ENCOUNTER — Encounter (HOSPITAL_COMMUNITY): Payer: Self-pay | Admitting: Neurosurgery

## 2014-08-19 MED ORDER — TRAMADOL HCL 50 MG PO TABS
50.0000 mg | ORAL_TABLET | Freq: Four times a day (QID) | ORAL | Status: DC | PRN
Start: 2014-08-19 — End: 2016-10-03

## 2014-08-19 MED ORDER — HYDROCODONE-ACETAMINOPHEN 5-325 MG PO TABS
1.0000 | ORAL_TABLET | Freq: Four times a day (QID) | ORAL | Status: DC | PRN
Start: 2014-08-19 — End: 2016-10-03

## 2014-08-19 NOTE — Discharge Summary (Signed)
  Physician Discharge Summary  Patient ID: Angel Fox MRN: [id number] DOB/AGE: 09-20-1940 74 y.o.  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 rate 18, height 5\' 10"  (1.778 m), weight 108.041 kg (238 lb 3 oz), SpO2 97 %. General appearance: alert, cooperative, appears stated age and no distress Neurologic: Grossly normal  Disposition: 01-Home or Self Care lumbar herniated disc    Medication List    TAKE these medications        aspirin EC 81 MG tablet  Take 81 mg by mouth daily.     atorvastatin 40 MG tablet  Commonly known as:  LIPITOR  Take 40 mg by mouth daily.     diclofenac 50 MG EC tablet  Commonly known as:  VOLTAREN  Take 1 tablet (50 mg total) by mouth 2 (two) times daily.     hydrochlorothiazide 25 MG tablet  Commonly known as:  HYDRODIURIL  Take 25 mg by mouth daily.     ibuprofen 200 MG tablet  Commonly known as:  ADVIL,MOTRIN  Take 600 mg by mouth every 6 (six) hours as needed (pain).     multivitamin with minerals Tabs tablet  Take 1 tablet by mouth daily.     potassium chloride SA 20 MEQ tablet  Commonly known as:  K-DUR,KLOR-CON  Take 20 mEq by mouth daily.     propranolol 40 MG tablet  Commonly known as:  INDERAL  Take 40 mg by mouth every morning.     tamoxifen 20 MG tablet  Commonly known as:  NOLVADEX  Take 20 mg by mouth daily.     tamsulosin 0.4 MG Caps capsule   Commonly known as:  FLOMAX  Take 0.4 mg by mouth daily.     traMADol 50 MG tablet  Commonly known as:  ULTRAM  Take 1 tablet (50 mg total) by mouth every 6 (six) hours as needed.           Follow-up Information    Follow up with Dray Dente L, MD In 3 weeks.   Specialty:  Neurosurgery   Why:  call office to make an appointment   Contact information:   [address] [place] [address] [phone]       Signed: Winfield Cunas 08/19/2014, 1:38 PM

## 2014-08-19 NOTE — Progress Notes (Signed)
Pt and family given D/C instructions with Rx's, verbal understanding was provided. Pt's IV was removed prior to D/C. Pt's incision is clean and dry with no sign of infection. Pt D/C'd home via wheelchair @ 1455 per MD order. Pt is stable @ D/C and has no other needs at this time. Holli Humbles, RN

## 2014-08-19 NOTE — Discharge Instructions (Signed)
Lumbar Discectomy Care After A discectomy involves removal of discmaterial (the cartilage-like structures located between the bones of the back). It is done to relieve pressure on nerve roots. It can be used as a treatment for a back problem. The time in surgery depends on the findings in surgery and what is necessary to correct the problems. HOME CARE INSTRUCTIONS   Check the cut (incision) made by the surgeon twice a day for signs of infection. Some signs of infection may include:   A foul smelling, greenish or yellowish discharge from the wound.   Increased pain.   Increased redness over the incision (operative) site.   The skin edges may separate.   Flu-like symptoms (problems).   A temperature above 101.5 F (38.6 C).   Change your bandages in about 24 to 36 hours following surgery or as directed.   You may shower tomorrow. Avoid bathtubs, swimming pools and hot tubs for three weeks or until your incision has healed completely. If you have stitches or staples, they may be removed 2 to 3 weeks after surgery, or as directed by your doctor. This may be done by your doctor or caregiver.   You may walk as much as you like. No need to exercise at this time. Limit lifting to ~10lbs.  Weight reduction may be beneficial if you are overweight.   Daily exercise is helpful to prevent the return of problems. Walking is permitted. You may use a treadmill without an incline. Cut down on activities and exercise if you have discomfort. You may also go up and down stairs as much as you can tolerate.   DO NOT lift anything heavier than 10 . Avoid bending or twisting at the waist. Always bend your knees when lifting.   Maintain strength and range of motion as instructed.   Do not drive for 2 to 3 weeks, or as directed by your doctors. You may be a passenger for 20 to 30 minute trips. Lying back in the passenger seat may be more comfortable for you. Always wear a seatbelt.   Limit your sitting in  a regular chair to 20 to 30 minutes at a time. There are no limitations for sitting in a recliner. You should lie down or walk in between sitting periods.   Only take over-the-counter or prescription medicines for pain, discomfort, or fever as directed by your caregiver.  SEEK MEDICAL CARE IF:   There is increased bleeding (more than a small spot) from the wound.   You notice redness, swelling, or increasing pain in the wound.   Pus is coming from wound.   You develop an unexplained oral temperature above 102 F (38.9 C) develops.   You notice a foul smell coming from the wound or dressing.   You have increasing pain in your wound.  SEEK IMMEDIATE MEDICAL CARE IF:   You develop a rash.   You have difficulty breathing.   You develop any allergic problems to medicines given.

## 2016-09-14 ENCOUNTER — Ambulatory Visit (INDEPENDENT_AMBULATORY_CARE_PROVIDER_SITE_OTHER): Payer: Medicare Other

## 2016-09-14 ENCOUNTER — Ambulatory Visit (INDEPENDENT_AMBULATORY_CARE_PROVIDER_SITE_OTHER): Payer: Medicare Other | Admitting: Orthopaedic Surgery

## 2016-09-14 DIAGNOSIS — M25551 Pain in right hip: Secondary | ICD-10-CM

## 2016-09-14 DIAGNOSIS — M1611 Unilateral primary osteoarthritis, right hip: Secondary | ICD-10-CM | POA: Diagnosis not present

## 2016-09-14 NOTE — Progress Notes (Signed)
Office Visit Note   Patient: Angel Fox           Date of Birth: [birth year]           MRN: [id number] Visit Date: 09/14/2016              Requested by: Nolene Ebbs, MD [address] [place], [place] [address] PCP: Patient, No Pcp Per   Assessment & Plan: Visit Diagnoses:  1. Pain in right hip   2. Unilateral primary osteoarthritis, right hip     Plan: Given the severe and profound arthritis in his right hip at this point the only treatment option is a hip replacement if he decides to have this done. Given the fact that his activities daily living his quality of life or detrimentally affected due to the severity of his hip pain and the fact that is hip arthritis is so severe I do feel that this would help decrease his pain, improve his mobility, and improved overall his quality of life. We went over his x-rays and I gave him a handout on hip replacement surgery and described the surgery to him in great detail it's pain in the risk and benefits of this. Again he has had a hip replacement on his other side before so he understands a lot of this. In his wife said they will take this information and think about it. I gave him our surgery scheduler's card and I'm happy to take care of him to have this done. All questions were encouraged and answered.  Follow-Up Instructions: Return if symptoms worsen or fail to improve.   Orders:  Orders Placed This Encounter  Procedures  . XR HIP UNILAT W OR W/O PELVIS 2-3 VIEWS RIGHT   No orders of the defined types were placed in this encounter.     Procedures: No procedures performed   Clinical Data: No additional findings.   Subjective: No chief complaint on file. The patient is a very pleasant 76 year old gentleman sent from his primary care physician to evaluate right leg pain. He comes in the office and limited cane. He has had a previous left total hip replacement and a right total knee arthroplasty done in [place] remotely. He's  also had back surgery. The back surgery of the lumbar spine was done just 2 years ago and this did affect his right side. His pain is in the groin and down the right side of his thigh. It is daily and is detrimentally affected his activities daily living, his quality of life, and his mobility. He has not had any x-rays of that hip. Is been getting worse for 2 years now. He says he did physical therapy and that did not help. He does take over-the-counter medications for pain. There is no pain when he sitting and being still.  HPI  Review of Systems He denies any headache, chest pain, short of breath, fever, chills, nausea, vomiting.  Objective: Vital Signs: There were no vitals taken for this visit.  Physical Exam He is alert and oriented 3 and in no acute distress Ortho Exam Examination of his right lower extremity shows good range of motion from a total knee arthroplasty knee and high can feel clicking of the total knee but there is no knee effusion in the knee feels ligamentously stable. Examination of the right hip though shows essentially almost no internal or external rotation and severe pain in the groin and the leg with me attempting to rotate his right hip. Specialty  Comments:  No specialty comments available.  Imaging: Xr Hip Unilat W Or W/o Pelvis 2-3 Views Right  Result Date: 09/14/2016 An AP pelvis and a lateral of his right hip shows profound end-stage arthritis. There is complete loss of the joint space. There is cystic changes in the femoral head and flattening of the femoral head. There is femoral head collapse as well. There is para-articular osteophytes.    PMFS History: Patient Active Problem List   Diagnosis Date Noted  . Pain in right hip 09/14/2016  . Unilateral primary osteoarthritis, right hip 09/14/2016  . HNP (herniated nucleus pulposus), lumbar 08/18/2014   Past Medical History:  Diagnosis Date  . Cancer    breast cancer  bilateral  . Hepatitis    hep  B  . Hypertension   . Urinary frequency     No family history on file.  Past Surgical History:  Procedure Laterality Date  . BREAST SURGERY Bilateral 2006   mastectomy,tx.cheno  . JOINT REPLACEMENT    . LUMBAR LAMINECTOMY/DECOMPRESSION MICRODISCECTOMY Right 08/18/2014   Procedure: RIGHT LUMBAR FOUR-FIVE, LUMBAR  FIVE-SACRAL ONE LAMINECTOMY/DECOMPRESSION MICRODISCECTOMY ;  Surgeon: Ashok Pall, MD;  Location: [facility];  Service: Neurosurgery;  Laterality: Right;  . MASTECTOMY Bilateral 2006   chemotherapy treatment  . MASTECTOMY Bilateral 2006   tx. chemotherapy  . TONSILLECTOMY    . TOTAL HIP ARTHROPLASTY Left   . varicose veins Right    vein striping   Social History   Occupational History  . Not on file.   Social History Main Topics  . Smoking status: Never Smoker  . Smokeless tobacco: Not on file  . Alcohol use Yes     Comment: rarely  . Drug use: No  . Sexual activity: Not on file

## 2016-10-04 ENCOUNTER — Encounter (HOSPITAL_COMMUNITY): Payer: Self-pay

## 2016-10-04 ENCOUNTER — Other Ambulatory Visit (INDEPENDENT_AMBULATORY_CARE_PROVIDER_SITE_OTHER): Payer: Self-pay | Admitting: Orthopaedic Surgery

## 2016-10-04 ENCOUNTER — Encounter (HOSPITAL_COMMUNITY)
Admission: RE | Admit: 2016-10-04 | Discharge: 2016-10-04 | Disposition: A | Discharge status: Home / Self Care | Payer: Medicare Other | Source: Ambulatory Visit | Attending: Orthopaedic Surgery | Admitting: Orthopaedic Surgery

## 2016-10-04 DIAGNOSIS — Z853 Personal history of malignant neoplasm of breast: Secondary | ICD-10-CM | POA: Diagnosis not present

## 2016-10-04 DIAGNOSIS — Z01812 Encounter for preprocedural laboratory examination: Secondary | ICD-10-CM | POA: Insufficient documentation

## 2016-10-04 DIAGNOSIS — M1611 Unilateral primary osteoarthritis, right hip: Secondary | ICD-10-CM

## 2016-10-04 DIAGNOSIS — I10 Essential (primary) hypertension: Secondary | ICD-10-CM | POA: Diagnosis not present

## 2016-10-04 DIAGNOSIS — Z9221 Personal history of antineoplastic chemotherapy: Secondary | ICD-10-CM | POA: Diagnosis not present

## 2016-10-04 DIAGNOSIS — Z9013 Acquired absence of bilateral breasts and nipples: Secondary | ICD-10-CM | POA: Diagnosis not present

## 2016-10-04 DIAGNOSIS — Z9889 Other specified postprocedural states: Secondary | ICD-10-CM | POA: Insufficient documentation

## 2016-10-04 HISTORY — DX: Unspecified osteoarthritis, unspecified site: M19.90

## 2016-10-04 LAB — BASIC METABOLIC PANEL
Anion gap: 7 (ref 5–15)
BUN: 13 mg/dL (ref 6–20)
CALCIUM: 9.3 mg/dL (ref 8.9–10.3)
CO2: 26 mmol/L (ref 22–32)
CREATININE: 1.2 mg/dL (ref 0.61–1.24)
Chloride: 100 mmol/L — ABNORMAL LOW (ref 101–111)
GFR calc Af Amer: >60 mL/min (ref >60)
GFR, EST NON AFRICAN AMERICAN: 57 mL/min — AB (ref >60)
Glucose, Bld: 88 mg/dL (ref 65–99)
Potassium: 3.8 mmol/L (ref 3.5–5.1)
Sodium: 133 mmol/L — ABNORMAL LOW (ref 135–145)

## 2016-10-04 LAB — CBC
HCT: 35.8 % — ABNORMAL LOW (ref 39.0–52.0)
Hemoglobin: 12.2 g/dL — ABNORMAL LOW (ref 13.0–17.0)
MCH: 29.8 pg (ref 26.0–34.0)
MCHC: 34.1 g/dL (ref 30.0–36.0)
MCV: 87.5 fL (ref 78.0–100.0)
PLATELETS: 186 10*3/uL (ref 150–400)
RBC: 4.09 MIL/uL — AB (ref 4.22–5.81)
RDW: 15.1 % (ref 11.5–15.5)
WBC: 5.9 10*3/uL (ref 4.0–10.5)

## 2016-10-04 LAB — SURGICAL PCR SCREEN
MRSA, PCR: NEGATIVE
Staphylococcus aureus: NEGATIVE

## 2016-10-04 NOTE — Progress Notes (Signed)
Called Sherrie and Dr. Trevor Mace office requesting orders for PAT appt.

## 2016-10-04 NOTE — Pre-Procedure Instructions (Signed)
Angel Fox  10/04/2016      [place] [address] Phone: [phone] Fax: [phone]  [id number] - [place], [place] [address] Phone: [phone] Fax: [phone]    Your procedure is scheduled on October 11, 2016.  Report to [facility] Admitting at 11:15 A.M.  Call this number if you have problems the morning of surgery:  [phone]   Remember:  Do not eat food or drink liquids after midnight.   Take these medicines the morning of surgery with A SIP OF WATER   Amlodipine (Norvasc) Propranolol (Inderal)  7 days prior to surgery STOP taking any Aspirin, Aleve, Naproxen, Ibuprofen, Motrin, Advil, Goody's, BC's, all herbal medications, fish oil, and all vitamins  Please continue all other medications as prescribed by your doctor.   Do not wear jewelry, make-up or nail polish.  Do not wear lotions, powders, or perfumes, or deodorant.  Do not shave 48 hours prior to surgery.  Men may shave face and neck.  Do not bring valuables to the hospital.   [facility] is not responsible for any belongings or valuables.  Contacts, dentures or bridgework may not be worn into surgery.  Leave your suitcase in the car.  After surgery it may be brought to your room.  For patients admitted to the hospital, discharge time will be determined by your treatment team.  Patients discharged the day of surgery will not be allowed to drive home.   Special instructions:   [facility]- Preparing For Surgery  Before surgery, you can play an important role. Because skin is not sterile, your skin needs to be as free of germs as possible. You can reduce the number of germs on your skin by washing with CHG (chlorahexidine gluconate) Soap before surgery.  CHG is an antiseptic cleaner which kills germs and bonds with the skin to continue killing  germs even after washing.  Please do not use if you have an allergy to CHG or antibacterial soaps. If your skin becomes reddened/irritated stop using the CHG.  Do not shave (including legs and underarms) for at least 48 hours prior to first CHG shower. It is OK to shave your face.  Please follow these instructions carefully.   1. Shower the NIGHT BEFORE SURGERY and the MORNING OF SURGERY with CHG.   2. If you chose to wash your hair, wash your hair first as usual with your normal shampoo.  3. After you shampoo, rinse your hair and body thoroughly to remove the shampoo.  4. Use CHG as you would any other liquid soap. You can apply CHG directly to the skin and wash gently with a scrungie or a clean washcloth.   5. Apply the CHG Soap to your body ONLY FROM THE NECK DOWN.  Do not use on open wounds or open sores. Avoid contact with your eyes, ears, mouth and genitals (private parts). Wash genitals (private parts) with your normal soap.  6. Wash thoroughly, paying special attention to the area where your surgery will be performed.  7. Thoroughly rinse your body with warm water from the neck down.  8. DO NOT shower/wash with your normal soap after using and rinsing off the CHG Soap.  9. Pat yourself dry with a CLEAN TOWEL.   10. Wear CLEAN PAJAMAS   11. Place CLEAN SHEETS on your bed the night  of your first shower and DO NOT SLEEP WITH PETS.    Day of Surgery: Do not apply any deodorants/lotions. Please wear clean clothes to the hospital/surgery center.      Please read over the following fact sheets that you were given. Coughing and Deep Breathing, MRSA Information and Surgical Site Infection Prevention

## 2016-10-10 MED ORDER — TRANEXAMIC ACID 1000 MG/10ML IV SOLN
1000.0000 mg | INTRAVENOUS | Status: AC
  Administered 2016-10-11: 1000 mg via INTRAVENOUS
  Filled 2016-10-10: qty 10

## 2016-10-10 NOTE — Progress Notes (Signed)
Anesthesia Chart Review: Patient is a 76 year old male scheduled for right THA, anterior approach on 10/11/16 by Dr. Jean Rosenthal.  History includes never smoker, HTN, breast cancer s/p bilateral mastectomies/chemotherapy '06, hepatitis B, arthritis, tonsillectomy, left THA, right L4-S1 microdiscectomy 08/18/14, RLE varicose vein stripping. BMI is consistent with obesity.   PCP is Dr. Nolene Ebbs with [facility].  Meds include amlodipine, ASA 81 mg, Lipitor, HCTZ, KCl, Inderal, tamoxifen.   BP 137/81   Pulse 64   Temp 37 C (Oral)   Resp 18   Ht 5\' 10"  (1.778 m)   Wt 229 lb 1 oz (103.9 kg)   SpO2 98%   BMI 32.87 kg/m   EKG 05/11/16 ([facility]): SB, first degree AV block, intra-atrial conduction delay.   Preoperative labs noted. Cr 1.20. Glucose 88. H/H 12.2/35.8.   If no acute changes then I anticipate that he can proceed as planned.  George Hugh [facility] Stay Center/Anesthesiology Phone [phone] 10/10/2016 12:26 PM

## 2016-10-11 ENCOUNTER — Inpatient Hospital Stay (HOSPITAL_COMMUNITY): Payer: Medicare Other | Admitting: Certified Registered"

## 2016-10-11 ENCOUNTER — Inpatient Hospital Stay (HOSPITAL_COMMUNITY)
Admission: RE | Admit: 2016-10-11 | Discharge: 2016-10-13 | DRG: 470 | Disposition: A | Discharge status: Home / Self Care | Payer: Medicare Other | Source: Ambulatory Visit | Attending: Orthopaedic Surgery | Admitting: Orthopaedic Surgery

## 2016-10-11 ENCOUNTER — Inpatient Hospital Stay (HOSPITAL_COMMUNITY): Payer: Medicare Other

## 2016-10-11 ENCOUNTER — Encounter (HOSPITAL_COMMUNITY): Payer: Self-pay | Admitting: *Deleted

## 2016-10-11 ENCOUNTER — Encounter (HOSPITAL_COMMUNITY)
Admission: RE | Disposition: A | Discharge status: Home / Self Care | Payer: Self-pay | Source: Ambulatory Visit | Attending: Orthopaedic Surgery

## 2016-10-11 ENCOUNTER — Inpatient Hospital Stay (HOSPITAL_COMMUNITY): Payer: Medicare Other | Admitting: Vascular Surgery

## 2016-10-11 DIAGNOSIS — Z853 Personal history of malignant neoplasm of breast: Secondary | ICD-10-CM

## 2016-10-11 DIAGNOSIS — Z96641 Presence of right artificial hip joint: Secondary | ICD-10-CM

## 2016-10-11 DIAGNOSIS — Z885 Allergy status to narcotic agent status: Secondary | ICD-10-CM

## 2016-10-11 DIAGNOSIS — Z96642 Presence of left artificial hip joint: Secondary | ICD-10-CM | POA: Diagnosis present

## 2016-10-11 DIAGNOSIS — M1611 Unilateral primary osteoarthritis, right hip: Secondary | ICD-10-CM | POA: Diagnosis present

## 2016-10-11 DIAGNOSIS — Z79899 Other long term (current) drug therapy: Secondary | ICD-10-CM

## 2016-10-11 DIAGNOSIS — Z9221 Personal history of antineoplastic chemotherapy: Secondary | ICD-10-CM | POA: Diagnosis not present

## 2016-10-11 DIAGNOSIS — Z419 Encounter for procedure for purposes other than remedying health state, unspecified: Secondary | ICD-10-CM

## 2016-10-11 DIAGNOSIS — I10 Essential (primary) hypertension: Secondary | ICD-10-CM | POA: Diagnosis present

## 2016-10-11 DIAGNOSIS — Z7982 Long term (current) use of aspirin: Secondary | ICD-10-CM

## 2016-10-11 DIAGNOSIS — Z9010 Acquired absence of unspecified breast and nipple: Secondary | ICD-10-CM

## 2016-10-11 HISTORY — PX: TOTAL HIP ARTHROPLASTY: SHX124

## 2016-10-11 HISTORY — DX: Unspecified viral hepatitis B without hepatic coma: B19.10

## 2016-10-11 HISTORY — DX: Malignant neoplasm of unspecified site of left female breast: C50.912

## 2016-10-11 HISTORY — DX: Acute embolism and thrombosis of unspecified femoral vein: I82.419

## 2016-10-11 HISTORY — DX: Malignant neoplasm of unspecified site of right female breast: C50.911

## 2016-10-11 SURGERY — ARTHROPLASTY, HIP, TOTAL, ANTERIOR APPROACH
Anesthesia: Spinal | Site: Hip | Laterality: Right

## 2016-10-11 MED ORDER — ADULT MULTIVITAMIN W/MINERALS CH
1.0000 | ORAL_TABLET | Freq: Every day | ORAL | Status: DC
  Administered 2016-10-12 – 2016-10-13 (×2): 1 via ORAL
  Filled 2016-10-11 (×2): qty 1

## 2016-10-11 MED ORDER — PROPOFOL 10 MG/ML IV BOLUS
INTRAVENOUS | Status: DC | PRN
  Administered 2016-10-11: 20 mg via INTRAVENOUS

## 2016-10-11 MED ORDER — PHENYLEPHRINE HCL 10 MG/ML IJ SOLN
INTRAVENOUS | Status: DC | PRN
  Administered 2016-10-11: 25 ug/min via INTRAVENOUS

## 2016-10-11 MED ORDER — LACTATED RINGERS IV SOLN
INTRAVENOUS | Status: DC
  Administered 2016-10-11 (×3): via INTRAVENOUS

## 2016-10-11 MED ORDER — ONDANSETRON HCL 4 MG/2ML IJ SOLN: 4.0000 mg | Freq: Once | INTRAMUSCULAR | Status: DC | PRN

## 2016-10-11 MED ORDER — ASPIRIN 81 MG PO CHEW
81.0000 mg | CHEWABLE_TABLET | Freq: Two times a day (BID) | ORAL | Status: DC
  Administered 2016-10-11 – 2016-10-13 (×4): 81 mg via ORAL
  Filled 2016-10-11 (×4): qty 1

## 2016-10-11 MED ORDER — HYDROCHLOROTHIAZIDE 25 MG PO TABS
25.0000 mg | ORAL_TABLET | Freq: Every day | ORAL | Status: DC
  Administered 2016-10-12 – 2016-10-13 (×2): 25 mg via ORAL
  Filled 2016-10-11 (×3): qty 1

## 2016-10-11 MED ORDER — SODIUM CHLORIDE 0.9 % IV SOLN
INTRAVENOUS | Status: DC
  Administered 2016-10-11 – 2016-10-12 (×2): via INTRAVENOUS

## 2016-10-11 MED ORDER — METHOCARBAMOL 500 MG PO TABS
500.0000 mg | ORAL_TABLET | Freq: Four times a day (QID) | ORAL | Status: DC | PRN
  Administered 2016-10-12 (×2): 500 mg via ORAL
  Filled 2016-10-11 (×2): qty 1

## 2016-10-11 MED ORDER — DEXAMETHASONE SODIUM PHOSPHATE 10 MG/ML IJ SOLN
INTRAMUSCULAR | Status: AC
  Filled 2016-10-11: qty 1

## 2016-10-11 MED ORDER — FENTANYL CITRATE (PF) 100 MCG/2ML IJ SOLN
INTRAMUSCULAR | Status: DC | PRN
  Administered 2016-10-11 (×2): 50 ug via INTRAVENOUS

## 2016-10-11 MED ORDER — ACETAMINOPHEN 325 MG PO TABS
650.0000 mg | ORAL_TABLET | Freq: Four times a day (QID) | ORAL | Status: DC | PRN
  Administered 2016-10-11 – 2016-10-12 (×3): 650 mg via ORAL
  Filled 2016-10-11 (×3): qty 2

## 2016-10-11 MED ORDER — PROPOFOL 500 MG/50ML IV EMUL
INTRAVENOUS | Status: DC | PRN
  Administered 2016-10-11: 50 ug/kg/min via INTRAVENOUS

## 2016-10-11 MED ORDER — 0.9 % SODIUM CHLORIDE (POUR BTL) OPTIME
TOPICAL | Status: DC | PRN
  Administered 2016-10-11: 1000 mL

## 2016-10-11 MED ORDER — ATORVASTATIN CALCIUM 40 MG PO TABS
40.0000 mg | ORAL_TABLET | Freq: Every evening | ORAL | Status: DC
  Administered 2016-10-11 – 2016-10-12 (×2): 40 mg via ORAL
  Filled 2016-10-11 (×2): qty 1

## 2016-10-11 MED ORDER — AMLODIPINE BESYLATE 10 MG PO TABS
10.0000 mg | ORAL_TABLET | Freq: Every day | ORAL | Status: DC
  Administered 2016-10-12 – 2016-10-13 (×2): 10 mg via ORAL
  Filled 2016-10-11 (×2): qty 1

## 2016-10-11 MED ORDER — PROPOFOL 10 MG/ML IV BOLUS
INTRAVENOUS | Status: AC
  Filled 2016-10-11: qty 20

## 2016-10-11 MED ORDER — ALUM & MAG HYDROXIDE-SIMETH 200-200-20 MG/5ML PO SUSP: 30.0000 mL | ORAL | Status: DC | PRN

## 2016-10-11 MED ORDER — LIDOCAINE HCL (CARDIAC) 20 MG/ML IV SOLN
INTRAVENOUS | Status: DC | PRN
  Administered 2016-10-11: 20 mg via INTRAVENOUS

## 2016-10-11 MED ORDER — DOCUSATE SODIUM 100 MG PO CAPS
100.0000 mg | ORAL_CAPSULE | Freq: Two times a day (BID) | ORAL | Status: DC
  Administered 2016-10-11 – 2016-10-13 (×4): 100 mg via ORAL
  Filled 2016-10-11 (×4): qty 1

## 2016-10-11 MED ORDER — CALCIUM CARBONATE-VITAMIN D 500-200 MG-UNIT PO TABS
1.0000 | ORAL_TABLET | Freq: Every day | ORAL | Status: DC
Start: 2016-10-11 — End: 2016-10-13
  Administered 2016-10-11 – 2016-10-13 (×3): 1 via ORAL
  Filled 2016-10-11 (×3): qty 1

## 2016-10-11 MED ORDER — PROPRANOLOL HCL 20 MG PO TABS
40.0000 mg | ORAL_TABLET | Freq: Every morning | ORAL | Status: DC
  Administered 2016-10-12 – 2016-10-13 (×2): 40 mg via ORAL
  Filled 2016-10-11 (×3): qty 2

## 2016-10-11 MED ORDER — HYDROMORPHONE HCL 1 MG/ML IJ SOLN: 1.0000 mg | INTRAMUSCULAR | Status: DC | PRN

## 2016-10-11 MED ORDER — CEFAZOLIN SODIUM-DEXTROSE 1-4 GM/50ML-% IV SOLN
1.0000 g | Freq: Four times a day (QID) | INTRAVENOUS | Status: AC
  Administered 2016-10-11 – 2016-10-12 (×2): 1 g via INTRAVENOUS
  Filled 2016-10-11 (×2): qty 50

## 2016-10-11 MED ORDER — METOCLOPRAMIDE HCL 5 MG PO TABS: 5.0000 mg | ORAL_TABLET | Freq: Three times a day (TID) | ORAL | Status: DC | PRN

## 2016-10-11 MED ORDER — SODIUM CHLORIDE 0.9 % IR SOLN
Status: DC | PRN
  Administered 2016-10-11: 3000 mL

## 2016-10-11 MED ORDER — CEFAZOLIN SODIUM-DEXTROSE 2-4 GM/100ML-% IV SOLN
INTRAVENOUS | Status: AC
  Filled 2016-10-11: qty 100

## 2016-10-11 MED ORDER — DEXAMETHASONE SODIUM PHOSPHATE 10 MG/ML IJ SOLN
INTRAMUSCULAR | Status: DC | PRN
  Administered 2016-10-11: 10 mg via INTRAVENOUS

## 2016-10-11 MED ORDER — DIPHENHYDRAMINE HCL 12.5 MG/5ML PO ELIX: 12.5000 mg | ORAL_SOLUTION | ORAL | Status: DC | PRN

## 2016-10-11 MED ORDER — FENTANYL CITRATE (PF) 250 MCG/5ML IJ SOLN
INTRAMUSCULAR | Status: AC
  Filled 2016-10-11: qty 5

## 2016-10-11 MED ORDER — MENTHOL 3 MG MT LOZG
1.0000 | LOZENGE | OROMUCOSAL | Status: DC | PRN
  Administered 2016-10-12: 3 mg via ORAL
  Filled 2016-10-11 (×2): qty 9

## 2016-10-11 MED ORDER — ONDANSETRON HCL 4 MG/2ML IJ SOLN
INTRAMUSCULAR | Status: DC | PRN
  Administered 2016-10-11: 4 mg via INTRAVENOUS

## 2016-10-11 MED ORDER — ONDANSETRON HCL 4 MG/2ML IJ SOLN
INTRAMUSCULAR | Status: AC
  Filled 2016-10-11: qty 2

## 2016-10-11 MED ORDER — FENTANYL CITRATE (PF) 100 MCG/2ML IJ SOLN: 25.0000 ug | INTRAMUSCULAR | Status: DC | PRN

## 2016-10-11 MED ORDER — PHENOL 1.4 % MT LIQD: 1.0000 | OROMUCOSAL | Status: DC | PRN

## 2016-10-11 MED ORDER — METHOCARBAMOL 1000 MG/10ML IJ SOLN
500.0000 mg | Freq: Four times a day (QID) | INTRAVENOUS | Status: DC | PRN
  Filled 2016-10-11: qty 5

## 2016-10-11 MED ORDER — BISACODYL 10 MG RE SUPP: 10.0000 mg | Freq: Every day | RECTAL | Status: DC | PRN

## 2016-10-11 MED ORDER — OXYCODONE HCL 5 MG PO TABS
5.0000 mg | ORAL_TABLET | ORAL | Status: DC | PRN
  Administered 2016-10-13: 5 mg via ORAL
  Filled 2016-10-11: qty 1

## 2016-10-11 MED ORDER — TRAMADOL HCL 50 MG PO TABS: 100.0000 mg | ORAL_TABLET | Freq: Four times a day (QID) | ORAL | Status: DC | PRN

## 2016-10-11 MED ORDER — POTASSIUM CHLORIDE 20 MEQ PO PACK
20.0000 meq | PACK | Freq: Every day | ORAL | Status: DC
  Administered 2016-10-11 – 2016-10-12 (×2): 20 meq via ORAL
  Filled 2016-10-11 (×3): qty 1

## 2016-10-11 MED ORDER — GLYCOPYRROLATE 0.2 MG/ML IJ SOLN
INTRAMUSCULAR | Status: DC | PRN
  Administered 2016-10-11: 0.2 mg via INTRAVENOUS

## 2016-10-11 MED ORDER — KETOROLAC TROMETHAMINE 30 MG/ML IJ SOLN
30.0000 mg | Freq: Four times a day (QID) | INTRAMUSCULAR | Status: DC
  Administered 2016-10-11 – 2016-10-12 (×3): 30 mg via INTRAVENOUS
  Filled 2016-10-11 (×4): qty 1

## 2016-10-11 MED ORDER — METOCLOPRAMIDE HCL 5 MG/ML IJ SOLN: 5.0000 mg | Freq: Three times a day (TID) | INTRAMUSCULAR | Status: DC | PRN

## 2016-10-11 MED ORDER — POLYETHYLENE GLYCOL 3350 17 G PO PACK: 17.0000 g | PACK | Freq: Every day | ORAL | Status: DC | PRN

## 2016-10-11 MED ORDER — ONDANSETRON HCL 4 MG/2ML IJ SOLN
4.0000 mg | Freq: Four times a day (QID) | INTRAMUSCULAR | Status: DC | PRN
  Administered 2016-10-11: 4 mg via INTRAVENOUS
  Filled 2016-10-11: qty 2

## 2016-10-11 MED ORDER — CEFAZOLIN SODIUM-DEXTROSE 2-4 GM/100ML-% IV SOLN
2.0000 g | INTRAVENOUS | Status: AC
  Administered 2016-10-11: 2 g via INTRAVENOUS

## 2016-10-11 MED ORDER — TAMOXIFEN CITRATE 10 MG PO TABS
20.0000 mg | ORAL_TABLET | Freq: Every evening | ORAL | Status: DC
  Administered 2016-10-11 – 2016-10-12 (×2): 20 mg via ORAL
  Filled 2016-10-11 (×3): qty 2

## 2016-10-11 MED ORDER — ACETAMINOPHEN 650 MG RE SUPP: 650.0000 mg | Freq: Four times a day (QID) | RECTAL | Status: DC | PRN

## 2016-10-11 MED ORDER — ONDANSETRON HCL 4 MG PO TABS: 4.0000 mg | ORAL_TABLET | Freq: Four times a day (QID) | ORAL | Status: DC | PRN

## 2016-10-11 SURGICAL SUPPLY — 52 items
BENZOIN TINCTURE PRP APPL 2/3 (GAUZE/BANDAGES/DRESSINGS) ×3 IMPLANT
BLADE CLIPPER SURG (BLADE) IMPLANT
BLADE SAW SGTL 18X1.27X75 (BLADE) ×2 IMPLANT
BLADE SAW SGTL 18X1.27X75MM (BLADE) ×1
CAPT HIP TOTAL 2 ×3 IMPLANT
CELLS DAT CNTRL 66122 CELL SVR (MISCELLANEOUS) ×1 IMPLANT
CLOSURE WOUND 1/2 X4 (GAUZE/BANDAGES/DRESSINGS) ×2
COVER SURGICAL LIGHT HANDLE (MISCELLANEOUS) ×3 IMPLANT
DRAPE C-ARM 42X72 X-RAY (DRAPES) ×3 IMPLANT
DRAPE STERI IOBAN 125X83 (DRAPES) ×3 IMPLANT
DRAPE U-SHAPE 47X51 STRL (DRAPES) ×9 IMPLANT
DRSG AQUACEL AG ADV 3.5X10 (GAUZE/BANDAGES/DRESSINGS) ×3 IMPLANT
DURAPREP 26ML APPLICATOR (WOUND CARE) ×3 IMPLANT
ELECT BLADE 4.0 EZ CLEAN MEGAD (MISCELLANEOUS) ×3
ELECT BLADE 6.5 EXT (BLADE) IMPLANT
ELECT REM PT RETURN 9FT ADLT (ELECTROSURGICAL) ×3
ELECTRODE BLDE 4.0 EZ CLN MEGD (MISCELLANEOUS) ×1 IMPLANT
ELECTRODE REM PT RTRN 9FT ADLT (ELECTROSURGICAL) ×1 IMPLANT
FACESHIELD WRAPAROUND (MASK) ×6 IMPLANT
GAUZE XEROFORM 5X9 LF (GAUZE/BANDAGES/DRESSINGS) ×3 IMPLANT
GLOVE BIOGEL PI IND STRL 8 (GLOVE) ×2 IMPLANT
GLOVE BIOGEL PI INDICATOR 8 (GLOVE) ×4
GLOVE ECLIPSE 8.0 STRL XLNG CF (GLOVE) ×3 IMPLANT
GLOVE ORTHO TXT STRL SZ7.5 (GLOVE) ×6 IMPLANT
GOWN STRL REUS W/ TWL LRG LVL3 (GOWN DISPOSABLE) ×2 IMPLANT
GOWN STRL REUS W/ TWL XL LVL3 (GOWN DISPOSABLE) ×2 IMPLANT
GOWN STRL REUS W/TWL LRG LVL3 (GOWN DISPOSABLE) ×4
GOWN STRL REUS W/TWL XL LVL3 (GOWN DISPOSABLE) ×4
HANDPIECE INTERPULSE COAX TIP (DISPOSABLE) ×2
KIT BASIN OR (CUSTOM PROCEDURE TRAY) ×3 IMPLANT
KIT ROOM TURNOVER OR (KITS) ×3 IMPLANT
MANIFOLD NEPTUNE II (INSTRUMENTS) ×3 IMPLANT
NS IRRIG 1000ML POUR BTL (IV SOLUTION) ×3 IMPLANT
PACK TOTAL JOINT (CUSTOM PROCEDURE TRAY) ×3 IMPLANT
PAD ARMBOARD 7.5X6 YLW CONV (MISCELLANEOUS) ×3 IMPLANT
RTRCTR WOUND ALEXIS 18CM MED (MISCELLANEOUS) ×3
SET HNDPC FAN SPRY TIP SCT (DISPOSABLE) ×1 IMPLANT
STAPLER VISISTAT 35W (STAPLE) IMPLANT
STRIP CLOSURE SKIN 1/2X4 (GAUZE/BANDAGES/DRESSINGS) ×4 IMPLANT
SUT ETHIBOND NAB CT1 #1 30IN (SUTURE) ×3 IMPLANT
SUT MNCRL AB 4-0 PS2 18 (SUTURE) IMPLANT
SUT VIC AB 0 CT1 27 (SUTURE) ×2
SUT VIC AB 0 CT1 27XBRD ANBCTR (SUTURE) ×1 IMPLANT
SUT VIC AB 1 CT1 27 (SUTURE) ×2
SUT VIC AB 1 CT1 27XBRD ANBCTR (SUTURE) ×1 IMPLANT
SUT VIC AB 2-0 CT1 27 (SUTURE) ×2
SUT VIC AB 2-0 CT1 TAPERPNT 27 (SUTURE) ×1 IMPLANT
TOWEL OR 17X24 6PK STRL BLUE (TOWEL DISPOSABLE) ×3 IMPLANT
TOWEL OR 17X26 10 PK STRL BLUE (TOWEL DISPOSABLE) ×3 IMPLANT
TRAY CATH 16FR W/PLASTIC CATH (SET/KITS/TRAYS/PACK) IMPLANT
TRAY FOLEY W/METER SILVER 16FR (SET/KITS/TRAYS/PACK) IMPLANT
WATER STERILE IRR 1000ML POUR (IV SOLUTION) ×6 IMPLANT

## 2016-10-11 NOTE — Anesthesia Postprocedure Evaluation (Signed)
Anesthesia Post Note  Patient: Angel Fox  Procedure(s) Performed: Procedure(s) (LRB): RIGHT TOTAL HIP ARTHROPLASTY ANTERIOR APPROACH (Right)     Patient location during evaluation: PACU Anesthesia Type: Spinal Level of consciousness: oriented and awake and alert Pain management: pain level controlled Vital Signs Assessment: post-procedure vital signs reviewed and stable Respiratory status: spontaneous breathing, respiratory function stable and patient connected to nasal cannula oxygen Cardiovascular status: blood pressure returned to baseline and stable Postop Assessment: no headache, no backache and no apparent nausea or vomiting Anesthetic complications: no    Last Vitals:  Vitals:   10/11/16 1600 10/11/16 1608  BP:  111/65  Pulse: (!) 58 (!) 51  Resp: 14 10  Temp: 36.6 C   SpO2: 100% 99%    Last Pain:  Vitals:   10/11/16 1713  TempSrc:   PainSc: 8                  Ryan P Ellender

## 2016-10-11 NOTE — Brief Op Note (Signed)
10/11/2016  2:43 PM  PATIENT:  Angel Fox  76 y.o. male  PRE-OPERATIVE DIAGNOSIS:  right hip osteoarthritis  POST-OPERATIVE DIAGNOSIS:  right hip osteoarthritis  PROCEDURE:  Procedure(s): RIGHT TOTAL HIP ARTHROPLASTY ANTERIOR APPROACH (Right)  SURGEON:  Surgeon(s) and Role:    Mcarthur Rossetti, MD - Primary  PHYSICIAN ASSISTANT: Benita Stabile, PA-C  ANESTHESIA:   spinal  EBL:  Total I/O In: 1000 [I.V.:1000] Out: 375 [Blood:375]  COUNTS:  YES  DICTATION: .Other Dictation: Dictation Number [id number]  PLAN OF CARE: Admit to inpatient   PATIENT DISPOSITION:  PACU - hemodynamically stable.   Delay start of Pharmacological VTE agent (>24hrs) due to surgical blood loss or risk of bleeding: no

## 2016-10-11 NOTE — Anesthesia Procedure Notes (Signed)
Date/Time: 10/11/2016 1:25 PM Performed by: Izora Gala Pre-anesthesia Checklist: Patient identified, Emergency Drugs available, Suction available and Patient being monitored Patient Re-evaluated:Patient Re-evaluated prior to induction Oxygen Delivery Method: Simple face mask Induction Type: IV induction Ventilation: Oral airway inserted - appropriate to patient size Placement Confirmation: positive ETCO2

## 2016-10-11 NOTE — Anesthesia Preprocedure Evaluation (Addendum)
Anesthesia Evaluation  Patient identified by MRN, date of birth, ID band Patient awake    Reviewed: Allergy & Precautions, NPO status , Patient's Chart, lab work & pertinent test results  Airway Mallampati: II  TM Distance: >3 FB Neck ROM: Full    Dental  (+) Edentulous Upper, Edentulous Lower   Pulmonary    breath sounds clear to auscultation       Cardiovascular hypertension,  Rhythm:Regular Rate:Normal     Neuro/Psych    GI/Hepatic   Endo/Other    Renal/GU      Musculoskeletal   Abdominal   Peds  Hematology   Anesthesia Other Findings   Reproductive/Obstetrics                             Anesthesia Physical Anesthesia Plan  ASA: III  Anesthesia Plan: Spinal   Post-op Pain Management:    Induction: Intravenous  PONV Risk Score and Plan: Ondansetron and Dexamethasone  Airway Management Planned: Simple Face Mask and Natural Airway  Additional Equipment:   Intra-op Plan:   Post-operative Plan:   Informed Consent: I have reviewed the patients History and Physical, chart, labs and discussed the procedure including the risks, benefits and alternatives for the proposed anesthesia with the patient or authorized representative who has indicated his/her understanding and acceptance.   Dental advisory given  Plan Discussed with: CRNA and Anesthesiologist  Anesthesia Plan Comments:        Anesthesia Quick Evaluation

## 2016-10-11 NOTE — Anesthesia Procedure Notes (Signed)
Spinal  Patient location during procedure: OR Start time: 10/11/2016 1:15 PM End time: 10/11/2016 1:20 PM Staffing Anesthesiologist: Linna Caprice, Zipporah Finamore Performed: anesthesiologist  Preanesthetic Checklist Completed: patient identified, site marked, surgical consent, pre-op evaluation, timeout performed, IV checked, risks and benefits discussed and monitors and equipment checked Spinal Block Patient position: sitting Prep: ChloraPrep Patient monitoring: heart rate, cardiac monitor, continuous pulse ox and blood pressure Approach: right paramedian Location: L3-4 Injection technique: single-shot Needle Needle type: Tuohy  Needle gauge: 22 G Needle length: 9 cm Needle insertion depth: 7 cm Assessment Sensory level: T8 Additional Notes 15 mg 0.75% injected easily

## 2016-10-11 NOTE — H&P (Signed)
TOTAL HIP ADMISSION H&P  Patient is admitted for right total hip arthroplasty.  Subjective:  Chief Complaint: right hip pain  HPI: Angel Fox, 76 y.o. male, has a history of pain and functional disability in the right hip(s) due to arthritis and patient has failed non-surgical conservative treatments for greater than 12 weeks to include NSAID's and/or analgesics, flexibility and strengthening excercises, supervised PT with diminished ADL's post treatment, use of assistive devices and activity modification.  Onset of symptoms was gradual starting 2 years ago with gradually worsening course since that time.The patient noted no past surgery on the right hip(s).  Patient currently rates pain in the right hip at 10 out of 10 with activity. Patient has night pain, worsening of pain with activity and weight bearing, pain that interfers with activities of daily living and pain with passive range of motion. Patient has evidence of subchondral sclerosis, periarticular osteophytes and joint space narrowing by imaging studies. This condition presents safety issues increasing the risk of falls.  There is no current active infection.  Patient Active Problem List   Diagnosis Date Noted  . Pain in right hip 09/14/2016  . Unilateral primary osteoarthritis, right hip 09/14/2016  . HNP (herniated nucleus pulposus), lumbar 08/18/2014   Past Medical History:  Diagnosis Date  . Arthritis   . Cancer ([place])    breast cancer  bilateral  . Hepatitis    hep B  . Hypertension   . Urinary frequency     Past Surgical History:  Procedure Laterality Date  . BREAST SURGERY Bilateral 2006   mastectomy,tx.[facility]  . JOINT REPLACEMENT    . LUMBAR LAMINECTOMY/DECOMPRESSION MICRODISCECTOMY Right 08/18/2014   Procedure: RIGHT LUMBAR FOUR-FIVE, LUMBAR  FIVE-SACRAL ONE LAMINECTOMY/DECOMPRESSION MICRODISCECTOMY ;  Surgeon: Ashok Pall, MD;  Location: [facility];  Service: Neurosurgery;  Laterality: Right;  . MASTECTOMY  Bilateral 2006   chemotherapy treatment  . MASTECTOMY Bilateral 2006   tx. chemotherapy  . TONSILLECTOMY    . TOTAL HIP ARTHROPLASTY Left   . varicose veins Right    vein striping    Prescriptions Prior to Admission  Medication Sig Dispense Refill Last Dose  . amLODipine (NORVASC) 10 MG tablet Take 10 mg by mouth daily.   10/11/2016 at 0730  . aspirin EC 81 MG tablet Take 81 mg by mouth daily.   Past Week at Unknown time  . atorvastatin (LIPITOR) 40 MG tablet Take 40 mg by mouth every evening.    Past Week at Unknown time  . Calcium Carbonate-Vit D-Min (CALCIUM 1200) 1200-1000 MG-UNIT CHEW Chew 1 tablet by mouth daily.   Past Week at Unknown time  . diclofenac sodium (VOLTAREN) 1 % GEL Apply 2 g topically 2 (two) times daily as needed (pain).   10/10/2016 at Unknown time  . hydrochlorothiazide (HYDRODIURIL) 25 MG tablet Take 25 mg by mouth daily.   10/10/2016 at Unknown time  . Multiple Vitamin (MULTIVITAMIN WITH MINERALS) TABS tablet Take 1 tablet by mouth daily.   Past Week at Unknown time  . potassium chloride (KLOR-CON) 20 MEQ packet Take 20 mEq by mouth daily.   10/10/2016 at Unknown time  . propranolol (INDERAL) 40 MG tablet Take 40 mg by mouth every morning.   10/11/2016 at 0730  . tamoxifen (NOLVADEX) 20 MG tablet Take 20 mg by mouth every evening.    10/10/2016 at Unknown time   Allergies  Allergen Reactions  . Codeine Other (See Comments)    dizzy  . Morphine And Related Other (See  Comments)    Dizziness and fainting  . Tramadol     Dizziness     Social History  Substance Use Topics  . Smoking status: Never Smoker  . Smokeless tobacco: Never Used  . Alcohol use 0.6 oz/week    1 Glasses of wine per week     Comment: rarely, 1 glass on Saturdays    History reviewed. No pertinent family history.   Review of Systems  Musculoskeletal: Positive for back pain and joint pain.  All other systems reviewed and are negative.   Objective:  Physical Exam  Constitutional: He is  oriented to person, place, and time. He appears well-developed and well-nourished.  HENT:  Head: Normocephalic and atraumatic.  Eyes: Pupils are equal, round, and reactive to light. EOM are normal.  Neck: Normal range of motion. Neck supple.  Cardiovascular: Normal rate and regular rhythm.   Respiratory: Effort normal and breath sounds normal.  GI: Soft. Bowel sounds are normal.  Musculoskeletal:       Right hip: He exhibits decreased range of motion, decreased strength, tenderness and bony tenderness.  Neurological: He is alert and oriented to person, place, and time.  Skin: Skin is warm and dry.  Psychiatric: He has a normal mood and affect.    Vital signs in last 24 hours: Temp:  [98.1 F (36.7 C)] 98.1 F (36.7 C) (09/18 1124) Pulse Rate:  [57] 57 (09/18 1124) Resp:  [18] 18 (09/18 1124) BP: (170)/(98) 170/98 (09/18 1124) SpO2:  [100 %] 100 % (09/18 1124) Weight:  [229 lb (103.9 kg)] 229 lb (103.9 kg) (09/18 1116)  Labs:   Estimated body mass index is 32.86 kg/m as calculated from the following:   Height as of this encounter: 5\' 10"  (1.778 m).   Weight as of this encounter: 229 lb (103.9 kg).   Imaging Review Plain radiographs demonstrate severe degenerative joint disease of the right hip(s). The bone quality appears to be good for age and reported activity level.  Assessment/Plan:  End stage arthritis, right hip(s)  The patient history, physical examination, clinical judgement of the provider and imaging studies are consistent with end stage degenerative joint disease of the right hip(s) and total hip arthroplasty is deemed medically necessary. The treatment options including medical management, injection therapy, arthroscopy and arthroplasty were discussed at length. The risks and benefits of total hip arthroplasty were presented and reviewed. The risks due to aseptic loosening, infection, stiffness, dislocation/subluxation,  thromboembolic complications and other  imponderables were discussed.  The patient acknowledged the explanation, agreed to proceed with the plan and consent was signed. Patient is being admitted for inpatient treatment for surgery, pain control, PT, OT, prophylactic antibiotics, VTE prophylaxis, progressive ambulation and ADL's and discharge planning.The patient is planning to be discharged home with home health services

## 2016-10-11 NOTE — Transfer of Care (Signed)
Immediate Anesthesia Transfer of Care Note  Patient: Angel Fox  Procedure(s) Performed: Procedure(s): RIGHT TOTAL HIP ARTHROPLASTY ANTERIOR APPROACH (Right)  Patient Location: PACU  Anesthesia Type:Spinal  Level of Consciousness: awake, alert , oriented and patient cooperative  Airway & Oxygen Therapy: Patient Spontanous Breathing  Post-op Assessment: Report given to RN, Post -op Vital signs reviewed and stable and Patient moving all extremities  Post vital signs: Reviewed and stable  Last Vitals:  Vitals:   10/11/16 1124  BP: (!) 170/98  Pulse: (!) 57  Resp: 18  Temp: 36.7 C  SpO2: 100%    Last Pain:  Vitals:   10/11/16 1124  TempSrc: Oral  PainSc:       Patients Stated Pain Goal: 3 (38/33/38 3291)  Complications: No apparent anesthesia complications

## 2016-10-11 NOTE — Evaluation (Signed)
Physical Therapy Evaluation Patient Details Name: Angel Fox MRN: [id number] DOB: [birth year] Today's Date: 10/11/2016   History of Present Illness  Pt is a 76 y/o male s/p elective R THA. PMH includes breast cancer s/p mastectomy, HTN, hepatitis B, s/p lumbar surgery, s/p L THA.   Clinical Impression  Pt is s/p surgery above with deficits below. PTA, pt was using cane for ambulation. Upon eval, pt limited by post op pain and weakness, and nausea/vomiting. Mobility limited to chair secondary to nausea/vomiting; RN notified. Pt required min to min guard assist for mobility. Reports wife will be able to assist at d/c and will need DME below. Follow up recommendations per MD arrangements. Will continue to follow acutely to maximize functional mobility independence and safety.     Follow Up Recommendations DC plan and follow up therapy as arranged by surgeon;Supervision for mobility/OOB    Equipment Recommendations  3in1 (PT)    Recommendations for Other Services       Precautions / Restrictions Precautions Precautions: None Precaution Comments: Verbally reviewed THA exercise handout as pt having episode of nausea/vomiting which limited further participation.  Restrictions Weight Bearing Restrictions: Yes RLE Weight Bearing: Weight bearing as tolerated      Mobility  Bed Mobility Overal bed mobility: Needs Assistance Bed Mobility: Supine to Sit     Supine to sit: Min guard     General bed mobility comments: Min guard for safety.   Transfers Overall transfer level: Needs assistance Equipment used: Rolling walker (2 wheeled) Transfers: Sit to/from [facility] Sit to Stand: Min assist Stand pivot transfers: Min guard       General transfer comment: Min A for steadying assist and for lift assist. Verbal cues for safe hand placement. Min guard for steadying during transfer. Limited mobility as pt feeling nauseous and had episode of vomiting.    Ambulation/Gait             General Gait Details: Able to take a few steps towards chair during stand pivot with min guard. Limited gait tolerance secondary to nausea/vomiting.   Stairs            Wheelchair Mobility    Modified Rankin (Stroke Patients Only)       Balance Overall balance assessment: Needs assistance Sitting-balance support: No upper extremity supported;Feet supported Sitting balance-Leahy Scale: Good     Standing balance support: Bilateral upper extremity supported;During functional activity Standing balance-Leahy Scale: Poor Standing balance comment: Reliant on RW for stability                              Pertinent Vitals/Pain Pain Assessment: 0-10 Pain Score: 8  Pain Location: R hip  Pain Descriptors / Indicators: Aching;Operative site guarding Pain Intervention(s): Limited activity within patient's tolerance;Monitored during session;Repositioned    Home Living Family/patient expects to be discharged to:: Private residence Living Arrangements: Spouse/significant other;Children Available Help at Discharge: Family;Available 24 hours/day Type of Home: House Home Access: Stairs to enter Entrance Stairs-Rails: None Entrance Stairs-Number of Steps: 1 (threshold step ) Home Layout: One level Home Equipment: Walker - 2 wheels;Cane - single point      Prior Function Level of Independence: Independent with assistive device(s)         Comments: Used cane at baseline.      Hand Dominance        Extremity/Trunk Assessment   Upper Extremity Assessment Upper Extremity Assessment: Overall WFL for tasks assessed  Lower Extremity Assessment Lower Extremity Assessment: RLE deficits/detail RLE Deficits / Details: Deficits consistent with post op pain and weakness. Sensory in tact.     Cervical / Trunk Assessment Cervical / Trunk Assessment: Normal  Communication   Communication: No difficulties  Cognition  Arousal/Alertness: Awake/alert Behavior During Therapy: WFL for tasks assessed/performed Overall Cognitive Status: Within Functional Limits for tasks assessed                                        General Comments General comments (skin integrity, edema, etc.): Pt's wife present during session. Concerned about not having 3 in 1, so assured we would recommend to increase safety with ADLs. Pt concerned about getting up day of surgery, however, educated about role of PT and early mobility.     Exercises     Assessment/Plan    PT Assessment Patient needs continued PT services  PT Problem List Decreased strength;Decreased range of motion;Decreased activity tolerance;Decreased balance;Decreased mobility;Decreased knowledge of use of DME;Pain       PT Treatment Interventions DME instruction;Stair training;Gait training;Therapeutic activities;Functional mobility training;Therapeutic exercise;Balance training;Neuromuscular re-education;Patient/family education    PT Goals (Current goals can be found in the Care Plan section)  Acute Rehab PT Goals Patient Stated Goal: to get better  PT Goal Formulation: With patient Time For Goal Achievement: 10/18/16 Potential to Achieve Goals: Good    Frequency 7X/week   Barriers to discharge        Co-evaluation               AM-PAC PT "6 Clicks" Daily Activity  Outcome Measure Difficulty turning over in bed (including adjusting bedclothes, sheets and blankets)?: A Little Difficulty moving from lying on back to sitting on the side of the bed? : A Little Difficulty sitting down on and standing up from a chair with arms (e.g., wheelchair, bedside commode, etc,.)?: Unable Help needed moving to and from a bed to chair (including a wheelchair)?: A Little Help needed walking in hospital room?: A Little Help needed climbing 3-5 steps with a railing? : A Lot 6 Click Score: 15    End of Session Equipment Utilized During  Treatment: Gait belt Activity Tolerance: Treatment limited secondary to medical complications (Comment) (nausea/vomiting) Patient left: in chair;with call bell/phone within reach;with nursing/sitter in room;with family/visitor present Nurse Communication: Mobility status;Other (comment) (need for nausea meds ) PT Visit Diagnosis: Other abnormalities of gait and mobility (R26.89);Pain Pain - Right/Left: Right Pain - part of body: Hip    Time: 1753-1820 PT Time Calculation (min) (ACUTE ONLY): 27 min   Charges:   PT Evaluation $PT Eval Low Complexity: 1 Low PT Treatments $Therapeutic Activity: 8-22 mins   PT G Codes:        Leighton Ruff, PT, DPT  Acute Rehabilitation Services  Pager: [phone]   Rudean Hitt 10/11/2016, 6:39 PM

## 2016-10-12 ENCOUNTER — Encounter (HOSPITAL_COMMUNITY): Payer: Self-pay

## 2016-10-12 LAB — CBC
HCT: 29.8 % — ABNORMAL LOW (ref 39.0–52.0)
HEMOGLOBIN: 10.2 g/dL — AB (ref 13.0–17.0)
MCH: 29.6 pg (ref 26.0–34.0)
MCHC: 34.2 g/dL (ref 30.0–36.0)
MCV: 86.4 fL (ref 78.0–100.0)
PLATELETS: 152 10*3/uL (ref 150–400)
RBC: 3.45 MIL/uL — ABNORMAL LOW (ref 4.22–5.81)
RDW: 15.2 % (ref 11.5–15.5)
WBC: 9.9 10*3/uL (ref 4.0–10.5)

## 2016-10-12 LAB — BASIC METABOLIC PANEL
ANION GAP: 9 (ref 5–15)
BUN: 16 mg/dL (ref 6–20)
CHLORIDE: 102 mmol/L (ref 101–111)
CO2: 19 mmol/L — ABNORMAL LOW (ref 22–32)
Calcium: 8.7 mg/dL — ABNORMAL LOW (ref 8.9–10.3)
Creatinine, Ser: 1.35 mg/dL — ABNORMAL HIGH (ref 0.61–1.24)
GFR, EST AFRICAN AMERICAN: 57 mL/min — AB (ref >60)
GFR, EST NON AFRICAN AMERICAN: 49 mL/min — AB (ref >60)
Glucose, Bld: 141 mg/dL — ABNORMAL HIGH (ref 65–99)
POTASSIUM: 4.3 mmol/L (ref 3.5–5.1)
SODIUM: 130 mmol/L — AB (ref 135–145)

## 2016-10-12 MED ORDER — KETOROLAC TROMETHAMINE 15 MG/ML IJ SOLN
15.0000 mg | Freq: Once | INTRAMUSCULAR | Status: AC
  Administered 2016-10-12: 15 mg via INTRAVENOUS
  Filled 2016-10-12: qty 1

## 2016-10-12 NOTE — Evaluation (Signed)
Occupational Therapy Evaluation Patient Details Name: Angel Fox MRN: [id number] DOB: [birth year] Today's Date: 10/12/2016    History of Present Illness Pt is a 76 y/o male s/p elective R THA. PMH includes breast cancer s/p mastectomy, HTN, hepatitis B, s/p lumbar surgery, s/p L THA.    Clinical Impression   PATIENT WAS EDUCATED ON USE OF AE FOR LE ADLS AND WAS EDUCATED ON WHERE TO PURCHASE EQUIPMENT. PATIENT WAS ABLE TO RETURN DEMO AT S LEVEL AND CONTINUED THERAPY WAS OFFERED. PATIENT STATES HE KNOWS HOW TO USE AE AND DOES NOT REQUIRE FURTHER EDUCATION. PATIENT WOULD BENEFIT FROM HHOT TO INCREASE SAFETY IN THE HOME.     Follow Up Recommendations  Home health OT    Equipment Recommendations  3 in 1 bedside commode    Recommendations for Other Services       Precautions / Restrictions Precautions Precautions: None Restrictions RLE Weight Bearing: Weight bearing as tolerated      Mobility Bed Mobility                  Transfers       Sit to Stand: Supervision Stand pivot transfers: Supervision            Balance                                           ADL either performed or assessed with clinical judgement   ADL Overall ADL's : Needs assistance/impaired Eating/Feeding: Independent   Grooming: Wash/dry hands;Wash/dry face;Supervision/safety   Upper Body Bathing: Set up   Lower Body Bathing: Minimal assistance   Upper Body Dressing : Set up   Lower Body Dressing: Supervision/safety;Set up;Sit to/from stand   Toilet Transfer: Supervision/safety;Stand-pivot   Toileting- Water quality scientist and Hygiene: Supervision/safety   Tub/ Banker: Min guard;Ambulation   Functional mobility during ADLs: Supervision/safety;Min guard General ADL Comments: PATIENT WAS SEEN FOR SKILLED OT TO EDUCATE PATIENT ON USE OF AE FOR LE ADLS. PNT WAS ABLE TO RETURN DEMO AT S LEVEL.      Vision Baseline Vision/History: Wears glasses Wears  Glasses: At all times Patient Visual Report: No change from baseline       Perception     Praxis      Pertinent Vitals/Pain Pain Assessment: 0-10 Pain Score: 4  Pain Location:  (r hip) Pain Descriptors / Indicators: Aching Pain Intervention(s): Limited activity within patient's tolerance     Hand Dominance     Extremity/Trunk Assessment Upper Extremity Assessment Upper Extremity Assessment: Overall WFL for tasks assessed           Communication Communication Communication: No difficulties   Cognition Arousal/Alertness: Awake/alert Behavior During Therapy: WFL for tasks assessed/performed Overall Cognitive Status: Within Functional Limits for tasks assessed                                     General Comments       Exercises     Shoulder Instructions      Home Living Family/patient expects to be discharged to:: Private residence Living Arrangements: Spouse/significant other;Children Available Help at Discharge: Family;Available 24 hours/day Type of Home: House Home Access: Stairs to enter [employer] of Steps: 1 (threshold step ) Entrance Stairs-Rails: None Home Layout: One level     Bathroom Shower/Tub: Walk-in shower  Bathroom Toilet: Standard     Home Equipment: Environmental consultant - 2 wheels;Cane - single point          Prior Functioning/Environment Level of Independence: Independent with assistive device(s)        Comments: Used cane at baseline.         OT Problem List: Decreased knowledge of use of DME or AE      OT Treatment/Interventions:      OT Goals(Current goals can be found in the care plan section) Acute Rehab OT Goals Patient Stated Goal: TO GO HOME  OT Frequency:     Barriers to D/C:            Co-evaluation              AM-PAC PT "6 Clicks" Daily Activity     Outcome Measure Help from another person eating meals?: None Help from another person taking care of personal grooming?: A  Little Help from another person toileting, which includes using toliet, bedpan, or urinal?: A Little Help from another person bathing (including washing, rinsing, drying)?: A Little Help from another person to put on and taking off regular upper body clothing?: None Help from another person to put on and taking off regular lower body clothing?: A Little 6 Click Score: 20   End of Session Equipment Utilized During Treatment: Gait belt;Rolling walker Nurse Communication:  (OK THERAPY)  Activity Tolerance: Patient tolerated treatment well Patient left: in chair;with call bell/phone within reach  OT Visit Diagnosis: Other abnormalities of gait and mobility (R26.89)                Time: 9741-6384 OT Time Calculation (min): 38 min Charges:  OT General Charges $OT Visit: 1 Visit OT Evaluation $OT Eval Low Complexity: 1 Low OT Treatments $Self Care/Home Management : 23-37 mins G-Codes:     6 CLICKS  Angel Fox 10/12/2016, 10:31 AM

## 2016-10-12 NOTE — Progress Notes (Signed)
Physical Therapy Treatment Patient Details Name: Angel Fox MRN: [id number] DOB: 18-Jun-1940 Today's Date: 10/12/2016    History of Present Illness Pt is a 76 y/o male s/p elective R THA. PMH includes breast cancer s/p mastectomy, HTN, hepatitis B, s/p lumbar surgery, s/p L THA.     PT Comments    Pt performed increased gait with improved gait speed and activity tolerance.  No signs of n/v today.  Pt performed supine and seated exercises with cues for technique.  Plan this afternoon for progression to standing exercises.    Follow Up Recommendations  DC plan and follow up therapy as arranged by surgeon;Supervision for mobility/OOB     Equipment Recommendations  3in1 (PT)    Recommendations for Other Services       Precautions / Restrictions Precautions Precautions: None Precaution Comments: Verbally reviewed THA exercise handout as pt having episode of nausea/vomiting which limited further participation.  Restrictions Weight Bearing Restrictions: Yes RLE Weight Bearing: Weight bearing as tolerated    Mobility  Bed Mobility               General bed mobility comments: Pt in recliner on arrival.    Transfers Overall transfer level: Needs assistance Equipment used: Rolling walker (2 wheeled) Transfers: Sit to/from [facility] Sit to Stand: Supervision Stand pivot transfers: Supervision       General transfer comment: Pt performed sit to stand with cues for hand placement to and from seated surface.    Ambulation/Gait Ambulation/Gait assistance: Min guard Ambulation Distance (Feet): 250 Feet Assistive device: Rolling walker (2 wheeled) Gait Pattern/deviations: Step-through pattern;Trunk flexed;Antalgic;Decreased stance time - right   Gait velocity interpretation: Below normal speed for age/gender General Gait Details: Cues for gait symmetry and upper trunk control.     Stairs            Wheelchair Mobility    Modified Rankin (Stroke  Patients Only)       Balance Overall balance assessment: Needs assistance Sitting-balance support: No upper extremity supported;Feet supported Sitting balance-Leahy Scale: Good       Standing balance-Leahy Scale: Poor Standing balance comment: Reliant on RW for stability                             Cognition Arousal/Alertness: Awake/alert Behavior During Therapy: WFL for tasks assessed/performed Overall Cognitive Status: Within Functional Limits for tasks assessed                                        Exercises Total Joint Exercises Ankle Circles/Pumps: AROM;Both;10 reps;Supine Quad Sets: AROM;Right;10 reps;Supine Short Arc Quad: AROM;Right;10 reps;Supine Heel Slides: AAROM;Right;10 reps;Supine Hip ABduction/ADduction: AAROM;Right;10 reps;Supine Long Arc Quad: AROM;Right;10 reps;Standing    General Comments        Pertinent Vitals/Pain Pain Assessment: 0-10 Pain Score: 3  Pain Location:  (r hip) Pain Descriptors / Indicators: Aching Pain Intervention(s): Monitored during session;Repositioned    Home Living Family/patient expects to be discharged to:: Private residence Living Arrangements: Spouse/significant other;Children Available Help at Discharge: Family;Available 24 hours/day Type of Home: House Home Access: Stairs to enter Entrance Stairs-Rails: None Home Layout: One level Home Equipment: Environmental consultant - 2 wheels;Cane - single point      Prior Function Level of Independence: Independent with assistive device(s)      Comments: Used cane at baseline.    PT Goals (  current goals can now be found in the care plan section) Acute Rehab PT Goals Patient Stated Goal: TO GO HOME Potential to Achieve Goals: Good Progress towards PT goals: Progressing toward goals    Frequency    7X/week      PT Plan Current plan remains appropriate    Co-evaluation              AM-PAC PT "6 Clicks" Daily Activity  Outcome Measure   Difficulty turning over in bed (including adjusting bedclothes, sheets and blankets)?: A Little Difficulty moving from lying on back to sitting on the side of the bed? : A Little Difficulty sitting down on and standing up from a chair with arms (e.g., wheelchair, bedside commode, etc,.)?: A Little Help needed moving to and from a bed to chair (including a wheelchair)?: A Little Help needed walking in hospital room?: A Little Help needed climbing 3-5 steps with a railing? : A Lot 6 Click Score: 17    End of Session Equipment Utilized During Treatment: Gait belt Activity Tolerance: Patient tolerated treatment well Patient left: in chair;with call bell/phone within reach;with nursing/sitter in room;with family/visitor present Nurse Communication: Mobility status PT Visit Diagnosis: Other abnormalities of gait and mobility (R26.89);Pain Pain - Right/Left: Right Pain - part of body: Hip     Time: 1007-1219 PT Time Calculation (min) (ACUTE ONLY): 16 min  Charges:  $Gait Training: 8-22 mins                    G Codes:       Governor Rooks, PTA pager [phone]    Cristela Blue 10/12/2016, 10:40 AM

## 2016-10-12 NOTE — Progress Notes (Signed)
Physical Therapy Treatment Patient Details Name: Angel Fox MRN: [id number] DOB: 12/30/1940 Today's Date: 10/12/2016    History of Present Illness Pt is a 76 y/o male s/p elective R THA. PMH includes breast cancer s/p mastectomy, HTN, hepatitis B, s/p lumbar surgery, s/p L THA.     PT Comments    Pt progressing towards goals. Reporting no pain this session. Required min guard for gait training this session and progressed to standing exercise program. Current recommendations appropriate. Will continue to follow acutely to maximize functional mobility independence and safety.    Follow Up Recommendations  DC plan and follow up therapy as arranged by surgeon;Supervision for mobility/OOB     Equipment Recommendations  3in1 (PT)    Recommendations for Other Services       Precautions / Restrictions Precautions Precautions: None Precaution Comments: Reviewed standing exercise with pt.  Restrictions Weight Bearing Restrictions: Yes RLE Weight Bearing: Weight bearing as tolerated    Mobility  Bed Mobility               General bed mobility comments: Pt in recliner upon entry   Transfers Overall transfer level: Needs assistance Equipment used: Rolling walker (2 wheeled) Transfers: Sit to/from Stand Sit to Stand: Supervision         General transfer comment: Supervision for safety. Demonstrated safe hand placement.   Ambulation/Gait Ambulation/Gait assistance: Min guard Ambulation Distance (Feet): 250 Feet Assistive device: Rolling walker (2 wheeled) Gait Pattern/deviations: Step-through pattern;Trunk flexed;Antalgic;Decreased stance time - right Gait velocity: Decreased Gait velocity interpretation: Below normal speed for age/gender General Gait Details: Cues for upright posture throughout. Cues for sequencing with RW.    Stairs            Wheelchair Mobility    Modified Rankin (Stroke Patients Only)       Balance Overall balance assessment: Needs  assistance Sitting-balance support: No upper extremity supported;Feet supported Sitting balance-Leahy Scale: Good     Standing balance support: Bilateral upper extremity supported;During functional activity Standing balance-Leahy Scale: Poor Standing balance comment: Reliant on RW for stability                             Cognition Arousal/Alertness: Awake/alert Behavior During Therapy: WFL for tasks assessed/performed Overall Cognitive Status: Within Functional Limits for tasks assessed                                        Exercises Total Joint Exercises Hip ABduction/ADduction: AROM;Right;10 reps;Standing (partial range. Verbal cues for upright posture) Knee Flexion: AROM;Right;10 reps;Standing Marching in Standing: AROM;Right;10 reps;Standing Standing Hip Extension: AROM;Right;10 reps;Standing (verbal cues for upright posture )    General Comments        Pertinent Vitals/Pain Pain Assessment: No/denies pain    Home Living                      Prior Function            PT Goals (current goals can now be found in the care plan section) Acute Rehab PT Goals Patient Stated Goal: to go home  PT Goal Formulation: With patient Time For Goal Achievement: 10/18/16 Potential to Achieve Goals: Good Progress towards PT goals: Progressing toward goals    Frequency    7X/week      PT Plan Current plan remains appropriate  Co-evaluation              AM-PAC PT "6 Clicks" Daily Activity  Outcome Measure  Difficulty turning over in bed (including adjusting bedclothes, sheets and blankets)?: A Little Difficulty moving from lying on back to sitting on the side of the bed? : A Little Difficulty sitting down on and standing up from a chair with arms (e.g., wheelchair, bedside commode, etc,.)?: A Little Help needed moving to and from a bed to chair (including a wheelchair)?: A Little Help needed walking in hospital room?: A  Little Help needed climbing 3-5 steps with a railing? : A Lot 6 Click Score: 17    End of Session Equipment Utilized During Treatment: Gait belt Activity Tolerance: Patient tolerated treatment well Patient left: in chair;with call bell/phone within reach Nurse Communication: Mobility status PT Visit Diagnosis: Other abnormalities of gait and mobility (R26.89);Pain Pain - Right/Left: Right Pain - part of body: Hip     Time: 8675-4492 PT Time Calculation (min) (ACUTE ONLY): 16 min  Charges:  $Gait Training: 8-22 mins                    G Codes:       Leighton Ruff, PT, DPT  Acute Rehabilitation Services  Pager: [phone]    Rudean Hitt 10/12/2016, 4:15 PM

## 2016-10-12 NOTE — Plan of Care (Signed)
Problem: Safety: Goal: Ability to remain free from injury will improve Outcome: Progressing No safety issues noted, safety precautions maintained  Problem: Pain Managment: Goal: General experience of comfort will improve Outcome: Progressing Medicated with Toradol, Tylenol and Robaxin at 0040 with moderate relief  Problem: Bowel/Gastric: Goal: Will not experience complications related to bowel motility Outcome: Progressing No bowel issues reported

## 2016-10-12 NOTE — Care Management Note (Signed)
Case Management Note  Patient Details  Name: Angel Fox MRN: [id number] Date of Birth: [birth year]  Subjective/Objective:   76 yr old gentleman s/p right total hip arthroplasty, anterior approach.                 Action/Plan: Case manager spoke with patient concerning discharge plan and DME . Patient was preoperatively setup with [facility], no changes. He will have family support at discharge.    Expected Discharge Date:    10/13/16              Expected Discharge Plan:  [place]  In-House Referral:  NA  Discharge planning Services  CM Consult  Post Acute Care Choice:  Durable Medical Equipment, Home Health Choice offered to:  Patient  DME Arranged:  3-N-1, Walker rolling DME Agency:  Eugenio Saenz:  PT [place] Agency:  [facility] (formerly [facility]  Status of Service:  Completed, signed off  If discussed at [employer], dates discussed:    Additional Comments:  Ninfa Meeker, RN 10/12/2016, 11:26 AM

## 2016-10-12 NOTE — Progress Notes (Signed)
Pt ambulated from chair to bed with walker and tolerated well. Pts urinal emptied. Pt given new ice pack for knee. Pt denies any further needs at this time. Will continue to monitor.

## 2016-10-12 NOTE — Op Note (Signed)
NAME:  Angel Fox, Angel Fox                     ACCOUNT NO.:  MEDICAL RECORD NO.:  [id number]  LOCATION:                                 FACILITY:  PHYSICIAN:  Lind Guest. Ninfa Linden, M.D.DATE OF BIRTH:  DATE OF PROCEDURE:  10/11/2016 DATE OF DISCHARGE:                              OPERATIVE REPORT   PREOPERATIVE DIAGNOSIS:  Severe osteoarthritis and degenerative joint disease, right hip.  POSTOPERATIVE DIAGNOSIS:  Severe osteoarthritis and degenerative joint disease, right hip.  PROCEDURE:  Right total hip arthroplasty through direct anterior approach.  IMPLANTS:  DePuy Sector Gription acetabular component size 62 with 3 screws, size 36 +0 polyethylene liner, size 12 Corail femoral component with varus offset, size 36 +5 ceramic hip ball.  SURGEON:  Lind Guest. Ninfa Linden, M.D.  ASSISTANT:  Erskine Emery, PA-C.  ANESTHESIA:  Spinal.  ANTIBIOTICS:  2 g of IV Ancef.  BLOOD LOSS:  400 mL.  COMPLICATIONS:  None.  INDICATIONS:  Ms. Hechler is a 76 year old gentleman, well known to me.  He has debilitating arthritis involving his right hip.  It is quite evident on x-rays and clinical exam.  He has complete loss of his joint space and significant degenerative changes in the femoral head with flattening of it and erosive changes as well.  He has had a previous left total hip arthroplasty done in [place] around 2006 and he has bilateral knee replacements.  His pain is daily and it has now detrimentally affected his activities of daily living, his quality of life, and his mobility. At this point, he does wish to proceed with a total hip arthroplasty. We talked about the risk of acute blood loss anemia, nerve and vessel injury, fracture, infection, dislocation, DVT.  He understands our goals are to decrease pain, improve mobility, and overall improved quality of life.  PROCEDURE DESCRIPTION:  After informed consent was obtained, appropriate right hip was marked.  He was brought to  the operating room.  Spinal anesthesia was obtained while he was on the stretcher.  He was then laid in a supine position on the stretcher, and traction boots were placed on both his feet.  Next, she was placed supine on the Hana fracture table with perineal post in place and both legs in inline skeletal traction devices, but no traction applied.  His right operative hip was prepped and draped with DuraPrep and sterile drapes.  A time-out was called and he was identified as correct patient and correct right hip.  We then made an incision just inferior and posterior to the anterior superior iliac spine and carried this obliquely down the leg.  We dissected down tensor fascia lata muscle, and the tensor fascia was then divided longitudinally to proceed with a direct anterior approach to the hip. We identified and cauterized the circumflex vessels and then identified the hip capsule.  I opened the hip capsule in an L-type format finding a very large joint effusion and significant hip disease.  We placed Cobra retractors around lateral and medial femoral neck and then made our femoral neck cut proximal to the lesser trochanter with an oscillating saw and completed this on osteotome.  I placed  a corkscrew guide in the femoral head and removed the femoral head in its entirety and found it to be severely diseased.  I then placed a bent Hohmann over the medial acetabular rim and cleaned remnants of acetabular labrum and other debris from around the acetabulum.  We then began reaming from a small reamer going in stepwise increments all the way up to a size 62 with all reamers under direct visualization, the last 2 reamers under direct fluoroscopy, so we could obtain our depth of reaming, our inclination and anteversion.  Once we did this, we placed the real DePuy Sector Gription acetabular component size 62 and I placed 3 screws.  Even though I felt like I got a good fit, I just felt given his  sclerotic bone that this would be appropriate.  He actually had screws on his other side previously as well.  I then placed a 36 +0 polyethylene liner for that size acetabular component.  Attention was then turned to the femur.  With the leg externally rotated to 120 degrees, extended and adducted, we were able to place a Mueller retractor medially and a Hohmann retractor behind the greater trochanter.  We released the lateral joint capsule and used a box cutting osteotome to enter the foraminal canal and a rongeur to lateralize.  We then began broaching from a size 8 broach using the Corail broaching system going up to a size 12.  With a size 12 in place, we trialed a varus offset femoral neck and a 36 +1.5 hip ball, brought the leg back over and up with traction and rotation reducing the pelvis, and we felt like we needed just a little bit more offset and leg length even though it was stable in motion and stability testing.  We dislocated the hip and removed the trial components.  I was able to place the real Corail femoral component with varus offset size 12 and then we went with a real 36 +5 ceramic hip ball, reduced this in acetabulum and we were pleased with stability, leg length, offset, and range of motion.  We then irrigated the soft tissue with normal saline solution using pulsatile lavage.  We were able to close the joint capsule with interrupted #1 Ethibond suture followed by running #1 Vicryl in the tensor fascia, 0 Vicryl in the deep tissue, 2-0 Vicryl on the subcutaneous tissue, interrupted staples on the skin. Xeroform and Aquacel dressing were applied.  He was taken off the Hana table and taken to the recovery room in stable condition.  All final counts were correct.  There were no complications noted.  Of note, Erskine Emery, PA-C, assisted in the entire case.  His assistance was crucial throughout all aspects of this case.     Lind Guest. Ninfa Linden,  M.D.     CYB/MEDQ  D:  10/11/2016  T:  10/12/2016  Job:  [id number]

## 2016-10-12 NOTE — Progress Notes (Signed)
Subjective: 1 Day Post-Op Procedure(s) (LRB): RIGHT TOTAL HIP ARTHROPLASTY ANTERIOR APPROACH (Right) Patient reports pain as moderate.  Nausea limiting pain meds that can be given.  Was placed on Toradol, however, creatine has increased.  Objective: Vital signs in last 24 hours: Temp:  [97.2 F (36.2 C)-99.5 F (37.5 C)] 99 F (37.2 C) (09/19 0503) Pulse Rate:  [51-77] 77 (09/19 0503) Resp:  [10-18] 16 (09/19 0503) BP: (90-170)/(56-98) 120/64 (09/19 0503) SpO2:  [98 %-100 %] 100 % (09/19 0503) Weight:  [229 lb (103.9 kg)] 229 lb (103.9 kg) (09/18 1116)  Intake/Output from previous day: 09/18 0701 - 09/19 0700 In: 2400 [P.O.:840; I.V.:1560] Out: 925 [Urine:200; Blood:725] Intake/Output this shift: No intake/output data recorded.   Recent Labs  10/12/16 0619  HGB 10.2*    Recent Labs  10/12/16 0619  WBC 9.9  RBC 3.45*  HCT 29.8*  PLT 152    Recent Labs  10/12/16 0619  NA 130*  K 4.3  CL 102  CO2 19*  BUN 16  CREATININE 1.35*  GLUCOSE 141*  CALCIUM 8.7*   No results for input(s): LABPT, INR in the last 72 hours.  Sensation intact distally Intact pulses distally Dorsiflexion/Plantar flexion intact Incision: dressing C/D/I  Assessment/Plan: 1 Day Post-Op Procedure(s) (LRB): RIGHT TOTAL HIP ARTHROPLASTY ANTERIOR APPROACH (Right) Up with therapy  Only one dose of toradol today at 1300 given his increase in creatinine.  Mcarthur Rossetti 10/12/2016, 7:51 AM

## 2016-10-13 MED ORDER — ASPIRIN EC 81 MG PO TBEC: 81.0000 mg | DELAYED_RELEASE_TABLET | Freq: Two times a day (BID) | ORAL | 0 refills | Status: AC

## 2016-10-13 MED ORDER — POTASSIUM CHLORIDE CRYS ER 20 MEQ PO TBCR
20.0000 meq | EXTENDED_RELEASE_TABLET | Freq: Every day | ORAL | Status: DC
  Administered 2016-10-13: 20 meq via ORAL
  Filled 2016-10-13: qty 1

## 2016-10-13 MED ORDER — ACETAMINOPHEN-CODEINE #3 300-30 MG PO TABS: 1.0000 | ORAL_TABLET | Freq: Three times a day (TID) | ORAL | 0 refills | Status: AC | PRN

## 2016-10-13 MED ORDER — METHOCARBAMOL 500 MG PO TABS: 500.0000 mg | ORAL_TABLET | Freq: Four times a day (QID) | ORAL | 0 refills | Status: AC | PRN

## 2016-10-13 MED ORDER — ONDANSETRON 4 MG PO TBDP: 4.0000 mg | ORAL_TABLET | Freq: Three times a day (TID) | ORAL | 0 refills | Status: AC | PRN

## 2016-10-13 NOTE — Progress Notes (Signed)
Physical Therapy Treatment Patient Details Name: Angel Fox MRN: [id number] DOB: [birth year] Today's Date: 10/13/2016    History of Present Illness Pt is a 76 y/o male s/p elective R THA. PMH includes breast cancer s/p mastectomy, HTN, hepatitis B, s/p lumbar surgery, s/p L THA.     PT Comments    Pt performed increased activity during session.  PTA reviewed bed mobility and supine exercises.  Pt refused stair training and denies that he has stairs to enter home.  Pt in bathroom with student nurse to bathe.  RN informed that patient is ready to d/c home from a mobility standpoint.     Follow Up Recommendations  DC plan and follow up therapy as arranged by surgeon;Supervision for mobility/OOB     Equipment Recommendations  3in1 (PT)    Recommendations for Other Services       Precautions / Restrictions Precautions Precautions: None Restrictions Weight Bearing Restrictions: Yes RLE Weight Bearing: Weight bearing as tolerated    Mobility  Bed Mobility Overal bed mobility: Needs Assistance Bed Mobility: Supine to Sit;Sit to Supine     Supine to sit: Supervision Sit to supine: Supervision   General bed mobility comments: Pt required increased time into and oob during session.    Transfers Overall transfer level: Needs assistance Equipment used: Rolling walker (2 wheeled) Transfers: Sit to/from Stand Sit to Stand: Supervision Stand pivot transfers: Supervision       General transfer comment: Supervision for safety. Demonstrated safe hand placement.   Ambulation/Gait Ambulation/Gait assistance: Min guard Ambulation Distance (Feet): 250 Feet Assistive device: Rolling walker (2 wheeled) Gait Pattern/deviations: Step-through pattern;Trunk flexed;Antalgic;Decreased stance time - right Gait velocity: Decreased Gait velocity interpretation: Below normal speed for age/gender General Gait Details: Cues for upright posture throughout. Cues for sequencing with RW.     Stairs Stairs:  (Pt denies having stairs to enter his home.  )          Wheelchair Mobility    Modified Rankin (Stroke Patients Only)       Balance Overall balance assessment: Needs assistance   Sitting balance-Leahy Scale: Good       Standing balance-Leahy Scale: Poor Standing balance comment: Reliant on RW for stability                             Cognition Arousal/Alertness: Awake/alert Behavior During Therapy: WFL for tasks assessed/performed Overall Cognitive Status: Within Functional Limits for tasks assessed                                        Exercises Total Joint Exercises Ankle Circles/Pumps: AROM;Both;10 reps;Supine Quad Sets: AROM;Right;10 reps;Supine Short Arc Quad: AROM;Right;10 reps;Supine Heel Slides: AAROM;Right;10 reps;Supine Hip ABduction/ADduction: AROM;Right;10 reps;Standing    General Comments        Pertinent Vitals/Pain Pain Assessment: 0-10 Pain Score: 3  Pain Location: R hip Pain Descriptors / Indicators: Aching Pain Intervention(s): Monitored during session;Repositioned    Home Living                      Prior Function            PT Goals (current goals can now be found in the care plan section) Acute Rehab PT Goals Patient Stated Goal: to wash up before going home Potential to Achieve Goals: Good Progress towards PT goals: Progressing  toward goals    Frequency    7X/week      PT Plan Current plan remains appropriate    Co-evaluation              AM-PAC PT "6 Clicks" Daily Activity  Outcome Measure  Difficulty turning over in bed (including adjusting bedclothes, sheets and blankets)?: A Little Difficulty moving from lying on back to sitting on the side of the bed? : A Little Difficulty sitting down on and standing up from a chair with arms (e.g., wheelchair, bedside commode, etc,.)?: A Little Help needed moving to and from a bed to chair (including a  wheelchair)?: A Little Help needed walking in hospital room?: A Little Help needed climbing 3-5 steps with a railing? : A Little 6 Click Score: 18    End of Session Equipment Utilized During Treatment: Gait belt Activity Tolerance: Patient tolerated treatment well Patient left: in chair;with call bell/phone within reach Nurse Communication: Mobility status PT Visit Diagnosis: Other abnormalities of gait and mobility (R26.89);Pain Pain - Right/Left: Right Pain - part of body: Hip     Time: 1022-1039 PT Time Calculation (min) (ACUTE ONLY): 17 min  Charges:  $Gait Training: 8-22 mins                    G Codes:       Governor Rooks, PTA pager [phone]    Cristela Blue 10/13/2016, 10:40 AM

## 2016-10-13 NOTE — Progress Notes (Signed)
Discharge teaching complete. Meds, diet, activity, incision care and follow up appointments reviewed and all questions answered. Copy of instructions and prescriptions given to patient. Patient discharged via wheelchair with son and wife.

## 2016-10-13 NOTE — Progress Notes (Signed)
Subjective: 2 Days Post-Op Procedure(s) (LRB): RIGHT TOTAL HIP ARTHROPLASTY ANTERIOR APPROACH (Right) Patient reports pain as moderate.  Reports progressing well with therapy and would like to go home today.  Objective: Vital signs in last 24 hours: Temp:  [98.4 F (36.9 C)-98.6 F (37 C)] 98.5 F (36.9 C) (09/20 0438) Pulse Rate:  [68-80] 71 (09/20 0438) Resp:  [16-17] 17 (09/20 0438) BP: (126-165)/(60-72) 165/72 (09/20 0438) SpO2:  [100 %] 100 % (09/20 0438)  Intake/Output from previous day: 09/19 0701 - 09/20 0700 In: 1182 [P.O.:1182] Out: 3025 [Urine:3025] Intake/Output this shift: No intake/output data recorded.   Recent Labs  10/12/16 0619  HGB 10.2*    Recent Labs  10/12/16 0619  WBC 9.9  RBC 3.45*  HCT 29.8*  PLT 152    Recent Labs  10/12/16 0619  NA 130*  K 4.3  CL 102  CO2 19*  BUN 16  CREATININE 1.35*  GLUCOSE 141*  CALCIUM 8.7*   No results for input(s): LABPT, INR in the last 72 hours.  Sensation intact distally Intact pulses distally Dorsiflexion/Plantar flexion intact Incision: dressing C/D/I No cellulitis present Compartment soft  Assessment/Plan: 2 Days Post-Op Procedure(s) (LRB): RIGHT TOTAL HIP ARTHROPLASTY ANTERIOR APPROACH (Right) Up with therapy Discharge home with home health today.  Mcarthur Rossetti 10/13/2016, 7:14 AM

## 2016-10-13 NOTE — Discharge Instructions (Signed)

## 2016-10-13 NOTE — Discharge Summary (Signed)
Patient ID: Duey Liller MRN: [id number] DOB/AGE: 1940-03-31 76 y.o.  Admit date: 10/11/2016 Discharge date: 10/13/2016  Admission Diagnoses:  Principal Problem:   Unilateral primary osteoarthritis, right hip Active Problems:   Status post total replacement of right hip   Discharge Diagnoses:  Same  Past Medical History:  Diagnosis Date  . Arthritis    "right leg" (10/11/2016)  . Bilateral breast cancer ([place]) 2006   S/P chemotherapy & radiation treatment  . Dvt femoral (deep venous thrombosis) ([place]) ~ 2003   RLE  . Hepatitis B   . Hypertension   . Urinary frequency     Surgeries: Procedure(s): RIGHT TOTAL HIP ARTHROPLASTY ANTERIOR APPROACH on 10/11/2016   Consultants:   Discharged Condition: Improved  Hospital Course: Bowie Delia is an 76 y.o. male who was admitted 10/11/2016 for operative treatment ofUnilateral primary osteoarthritis, right hip. Patient has severe unremitting pain that affects sleep, daily activities, and work/hobbies. After pre-op clearance the patient was taken to the operating room on 10/11/2016 and underwent  Procedure(s): RIGHT TOTAL HIP ARTHROPLASTY ANTERIOR APPROACH.    Patient was given perioperative antibiotics: Anti-infectives    Start     Dose/Rate Route Frequency Ordered Stop   10/11/16 1930  ceFAZolin (ANCEF) IVPB 1 g/50 mL premix     1 g 100 mL/hr over 30 Minutes Intravenous Every 6 hours 10/11/16 1630 10/12/16 0110   10/11/16 1102  ceFAZolin (ANCEF) IVPB 2g/100 mL premix     2 g 200 mL/hr over 30 Minutes Intravenous On call to O.R. 10/11/16 1102 10/11/16 1330   10/11/16 1047  ceFAZolin (ANCEF) 2-4 GM/100ML-% IVPB    Comments:  Scronce, Trina   : cabinet override      10/11/16 1047 10/11/16 1330       Patient was given sequential compression devices, early ambulation, and chemoprophylaxis to prevent DVT.  Patient benefited maximally from hospital stay and there were no complications.    Recent vital signs: Patient Vitals for the  past 24 hrs:  BP Temp Temp src Pulse Resp SpO2  10/13/16 0438 (!) 165/72 98.5 F (36.9 C) Oral 71 17 100 %  10/12/16 2249 132/60 98.6 F (37 C) Oral 80 16 100 %  10/12/16 1500 126/65 98.4 F (36.9 C) Oral 68 16 100 %     Recent laboratory studies:  Recent Labs  10/12/16 0619  WBC 9.9  HGB 10.2*  HCT 29.8*  PLT 152  NA 130*  K 4.3  CL 102  CO2 19*  BUN 16  CREATININE 1.35*  GLUCOSE 141*  CALCIUM 8.7*     Discharge Medications:   Allergies as of 10/13/2016      Reactions   Codeine Other (See Comments)   dizzy   Morphine And Related Other (See Comments)   Dizziness and fainting   Tramadol    Dizziness       Medication List    TAKE these medications   acetaminophen-codeine 300-30 MG tablet Commonly known as:  TYLENOL #3 Take 1-2 tablets by mouth every 8 (eight) hours as needed for moderate pain.   amLODipine 10 MG tablet Commonly known as:  NORVASC Take 10 mg by mouth daily.   aspirin EC 81 MG tablet Take 1 tablet (81 mg total) by mouth 2 (two) times daily after a meal. What changed:  when to take this   atorvastatin 40 MG tablet Commonly known as:  LIPITOR Take 40 mg by mouth every evening.   CALCIUM 1200 1200-1000 MG-UNIT Chew Chew 1 tablet  by mouth daily.   diclofenac sodium 1 % Gel Commonly known as:  VOLTAREN Apply 2 g topically 2 (two) times daily as needed (pain).   hydrochlorothiazide 25 MG tablet Commonly known as:  HYDRODIURIL Take 25 mg by mouth daily.   methocarbamol 500 MG tablet Commonly known as:  ROBAXIN Take 1 tablet (500 mg total) by mouth every 6 (six) hours as needed for muscle spasms.   multivitamin with minerals Tabs tablet Take 1 tablet by mouth daily.   ondansetron 4 MG disintegrating tablet Commonly known as:  ZOFRAN ODT Take 1 tablet (4 mg total) by mouth every 8 (eight) hours as needed for nausea or vomiting.   potassium chloride 20 MEQ packet Commonly known as:  KLOR-CON Take 20 mEq by mouth daily.    propranolol 40 MG tablet Commonly known as:  INDERAL Take 40 mg by mouth every morning.   tamoxifen 20 MG tablet Commonly known as:  NOLVADEX Take 20 mg by mouth every evening.            Durable Medical Equipment        Start     Ordered   10/11/16 1631  DME 3 n 1  Once     10/11/16 1630   10/11/16 1631  DME Walker rolling  Once    Question:  Patient needs a walker to treat with the following condition  Answer:  Status post total replacement of right hip   10/11/16 1630       Discharge Care Instructions        Start     Ordered   10/13/16 0000  aspirin EC 81 MG tablet  2 times daily after meals     10/13/16 0718   10/13/16 0000  methocarbamol (ROBAXIN) 500 MG tablet  Every 6 hours PRN     10/13/16 0718   10/13/16 0000  Discharge patient    Question Answer Comment  Discharge disposition 01-Home or Self Care   Discharge patient date 10/13/2016      10/13/16 2706   10/13/16 0000  ondansetron (ZOFRAN ODT) 4 MG disintegrating tablet  Every 8 hours PRN     10/13/16 0718   10/13/16 0000  acetaminophen-codeine (TYLENOL #3) 300-30 MG tablet  Every 8 hours PRN     10/13/16 2376      Diagnostic Studies: Dg Pelvis Portable  Result Date: 10/11/2016 CLINICAL DATA:  Status post hip replacement. EXAM: PORTABLE PELVIS 1-2 VIEWS COMPARISON:  October 11, 2016 FINDINGS: The patient is status post right hip replacement. Acetabular and femoral components are in good position. Postoperative skin staples are identified. The patient is also status post left hip replacement. IMPRESSION: Right hip replacement as above. Electronically Signed   By: Dorise Bullion III M.D   On: 10/11/2016 16:43   Dg C-arm 1-60 Min  Result Date: 10/11/2016 CLINICAL DATA:  76 year old male status post total hip arthroplasty with anterior approach on the right. EXAM: OPERATIVE RIGHT HIP (WITH PELVIS IF PERFORMED) 2 VIEWS TECHNIQUE: Fluoroscopic spot image(s) were submitted for interpretation  post-operatively. COMPARISON:  Pelvis radiographs 09/14/2016 FLUOROSCOPY TIME:  0 minutes 38 seconds FINDINGS: 2 intraoperative fluoroscopic spot views of the lower pelvis and right hip in the AP projection. New right total hip arthroplasty hardware appears intact and normally aligned in the AP plane. Preexisting left total hip arthroplasty. IMPRESSION: New right total hip arthroplasty.  No adverse features identified. Electronically Signed   By: Genevie Ann M.D.   On: 10/11/2016 14:55  Dg Hip Operative Unilat W Or W/o Pelvis Right  Result Date: 10/11/2016 CLINICAL DATA:  76 year old male status post total hip arthroplasty with anterior approach on the right. EXAM: OPERATIVE RIGHT HIP (WITH PELVIS IF PERFORMED) 2 VIEWS TECHNIQUE: Fluoroscopic spot image(s) were submitted for interpretation post-operatively. COMPARISON:  Pelvis radiographs 09/14/2016 FLUOROSCOPY TIME:  0 minutes 38 seconds FINDINGS: 2 intraoperative fluoroscopic spot views of the lower pelvis and right hip in the AP projection. New right total hip arthroplasty hardware appears intact and normally aligned in the AP plane. Preexisting left total hip arthroplasty. IMPRESSION: New right total hip arthroplasty.  No adverse features identified. Electronically Signed   By: Genevie Ann M.D.   On: 10/11/2016 14:55   Xr Hip Unilat W Or W/o Pelvis 2-3 Views Right  Result Date: 09/14/2016 An AP pelvis and a lateral of his right hip shows profound end-stage arthritis. There is complete loss of the joint space. There is cystic changes in the femoral head and flattening of the femoral head. There is femoral head collapse as well. There is para-articular osteophytes.   Disposition: 01-Home or Self Care  Discharge Instructions    Discharge patient    Complete by:  As directed    Discharge disposition:  01-Home or Self Care   Discharge patient date:  10/13/2016      Follow-up Information    [facility] Follow up.   Specialty:  [place] Why:  A representative from [facility] will contact you to arrange start date and time for your therapy. Contact information: [address] [place] [address] [phone]        Mcarthur Rossetti, MD Follow up in 2 week(s).   Specialty:  Orthopedic Surgery Contact information: [place] [address] [phone]            Signed: Mcarthur Rossetti 10/13/2016, 7:19 AM

## 2016-10-18 ENCOUNTER — Other Ambulatory Visit (INDEPENDENT_AMBULATORY_CARE_PROVIDER_SITE_OTHER): Payer: Self-pay | Admitting: Orthopaedic Surgery

## 2016-10-25 ENCOUNTER — Ambulatory Visit (INDEPENDENT_AMBULATORY_CARE_PROVIDER_SITE_OTHER): Payer: Medicare Other | Admitting: Orthopaedic Surgery

## 2016-10-25 DIAGNOSIS — Z96641 Presence of right artificial hip joint: Secondary | ICD-10-CM

## 2016-10-25 MED ORDER — CIPROFLOXACIN HCL 250 MG PO TABS: 250.0000 mg | ORAL_TABLET | Freq: Two times a day (BID) | ORAL | 0 refills | Status: AC

## 2016-10-25 NOTE — Progress Notes (Signed)
The patient is now 2 weeks status post a right total hip arthroplasty through direct anterior approach. He reports that he is doing well.  Twice a day. He said 3 days of urinary frequency and straining he's had urinary tract infections in the past. He like to have an antibiotic for this.  On exam his incision looks great. Staples are removed. He does have a moderate seroma did aspirate 60 mL of seroma fluid from around the incision. His leg lengths are near equal. He is getting around with a cane.  At this point he'll stop his aspirin and go back to just once daily instead of twice daily. We will send in some Cipro for possible urinary tract infection. If his urologic symptoms worsen he should see his primary care urologist about this. We'll see him back in 4 weeks to see how is doing overall. He says he doesn't need any pain medications. All questions and concerns were answered and addressed.

## 2016-11-23 ENCOUNTER — Ambulatory Visit (INDEPENDENT_AMBULATORY_CARE_PROVIDER_SITE_OTHER): Payer: Medicare Other | Admitting: Orthopaedic Surgery

## 2016-11-23 DIAGNOSIS — Z96641 Presence of right artificial hip joint: Secondary | ICD-10-CM

## 2016-11-23 NOTE — Progress Notes (Signed)
The patient is now 6 weeks status post a right total hip arthroplasty through direct anterior approach.  He is able with a cane and said he is doing excellent.  He feels like his range of motion and strength are improving.  He is 76 years old.  His wife says he does exercises every day.  On exam he tolerates me easily putting his right hip through range of motion.  He has no difficulties otherwise.  We do not really need to see him back for 3 months at this point.  All questions were encouraged and answered.  At 3 months I would like just a low AP pelvis.

## 2017-02-22 ENCOUNTER — Ambulatory Visit (INDEPENDENT_AMBULATORY_CARE_PROVIDER_SITE_OTHER): Payer: Medicare Other | Admitting: Orthopaedic Surgery

## 2017-03-23 ENCOUNTER — Ambulatory Visit (INDEPENDENT_AMBULATORY_CARE_PROVIDER_SITE_OTHER): Payer: Medicare Other | Admitting: Orthopaedic Surgery

## 2017-03-29 ENCOUNTER — Encounter (INDEPENDENT_AMBULATORY_CARE_PROVIDER_SITE_OTHER): Payer: Self-pay | Admitting: Orthopaedic Surgery

## 2017-03-29 ENCOUNTER — Ambulatory Visit (INDEPENDENT_AMBULATORY_CARE_PROVIDER_SITE_OTHER): Payer: Medicare Other

## 2017-03-29 ENCOUNTER — Ambulatory Visit (INDEPENDENT_AMBULATORY_CARE_PROVIDER_SITE_OTHER): Payer: Medicare Other | Admitting: Orthopaedic Surgery

## 2017-03-29 DIAGNOSIS — Z96641 Presence of right artificial hip joint: Secondary | ICD-10-CM

## 2017-03-29 NOTE — Progress Notes (Signed)
The patient is now 6 months status post a right total hip arthroplasty.  He is 18 years out from a left total hip arthroplasty.  He said he is doing well overall.  He is a significantly obese 77 years old.  We had a long and thorough discussion about exercises and thinks he needs to do to stay active and lose weight.  On exam both hips show fluid and full range of motion with no significant issues at all.  An AP pelvis shows bilateral total hip arthroplasty with no acute findings in good alignment.  At this point we are discussion about how to exercise and to stay healthy.  All questions concerns were answered and addressed.  Follow-up at this point as needed.

## 2017-04-20 ENCOUNTER — Other Ambulatory Visit: Payer: Self-pay | Admitting: Internal Medicine

## 2017-04-20 DIAGNOSIS — E2839 Other primary ovarian failure: Secondary | ICD-10-CM

## 2017-05-11 ENCOUNTER — Ambulatory Visit: Payer: Medicare Other | Admitting: Podiatry

## 2017-05-17 ENCOUNTER — Other Ambulatory Visit: Payer: Self-pay | Admitting: Internal Medicine

## 2017-05-17 ENCOUNTER — Inpatient Hospital Stay
Admission: RE | Admit: 2017-05-17 | Discharge: 2017-05-17 | Disposition: A | Discharge status: Home / Self Care | Payer: Medicare Other | Source: Ambulatory Visit | Attending: Internal Medicine | Admitting: Internal Medicine

## 2017-05-17 DIAGNOSIS — M8580 Other specified disorders of bone density and structure, unspecified site: Secondary | ICD-10-CM

## 2017-05-18 ENCOUNTER — Emergency Department (HOSPITAL_COMMUNITY)
Admission: EM | Admit: 2017-05-18 | Discharge: 2017-05-18 | Disposition: A | Discharge status: Home / Self Care | Payer: Medicare Other | Attending: Emergency Medicine | Admitting: Emergency Medicine

## 2017-05-18 ENCOUNTER — Encounter (HOSPITAL_COMMUNITY): Payer: Self-pay

## 2017-05-18 ENCOUNTER — Other Ambulatory Visit: Payer: Self-pay

## 2017-05-18 DIAGNOSIS — Z96643 Presence of artificial hip joint, bilateral: Secondary | ICD-10-CM | POA: Diagnosis not present

## 2017-05-18 DIAGNOSIS — Z79899 Other long term (current) drug therapy: Secondary | ICD-10-CM | POA: Insufficient documentation

## 2017-05-18 DIAGNOSIS — L239 Allergic contact dermatitis, unspecified cause: Secondary | ICD-10-CM | POA: Diagnosis not present

## 2017-05-18 DIAGNOSIS — L2389 Allergic contact dermatitis due to other agents: Secondary | ICD-10-CM

## 2017-05-18 DIAGNOSIS — I10 Essential (primary) hypertension: Secondary | ICD-10-CM | POA: Diagnosis not present

## 2017-05-18 DIAGNOSIS — R21 Rash and other nonspecific skin eruption: Secondary | ICD-10-CM | POA: Diagnosis present

## 2017-05-18 MED ORDER — TRIAMCINOLONE ACETONIDE 0.1 % EX CREA: 1.0000 "application " | TOPICAL_CREAM | Freq: Two times a day (BID) | CUTANEOUS | 1 refills | Status: AC

## 2017-05-18 MED ORDER — DIPHENHYDRAMINE HCL 25 MG PO TABS: 25.0000 mg | ORAL_TABLET | Freq: Four times a day (QID) | ORAL | 0 refills | Status: AC

## 2017-05-18 NOTE — ED Triage Notes (Signed)
Pt states that he has had a rash for about a week, unsure of where it came from, denies pain states he has been using topical benadryl.

## 2017-05-18 NOTE — ED Provider Notes (Signed)
77 year old male here with an itchy rash to his anterior chest just above previous mastectomy site.  States he thinks it might be related to cologne or so as he did use new ones recently.  On exam he has mild erythema and papular rash to his left upper chest and just both below his neck.  His lungs are clear vital signs are within normal limits doubt anaphylaxis.  Topical treatment with steroids and p.o. Benadryl as needed for itching is appropriate.  PCP follow-up.   Shacora Zynda, Corene Cornea, MD 05/19/17 [phone]

## 2017-05-18 NOTE — Discharge Instructions (Addendum)
Apply kenalog cream only to the rash area. Take benadryl as prescribed as needed for itching. Avoid using the cologne. Follow up with family doctor in 3-4 days to make sure rash improves.

## 2017-05-18 NOTE — ED Provider Notes (Signed)
[facility] EMERGENCY DEPARTMENT Provider Note   CSN: [id number] Arrival date & time: 05/18/17  1649     History   Chief Complaint Chief Complaint  Patient presents with  . Rash    HPI Angel Fox is a 77 y.o. male.  HPI Angel Fox is a 77 y.o. male complains of her off of a cough and lower neck.  Patient states rash started several days ago after he sprayed a [place].  States rash is red and itchy.  Denies any pain.  Denies any fever or chills.  No rash anywhere else the body.  He has not switched any products, has not switched any new soaps or detergents.  He denies history of similar rash in the past.  He has been applying Benadryl cream with no relief.  He states it is very itchy.  Past Medical History:  Diagnosis Date  . Arthritis    "right leg" (10/11/2016)  . Bilateral breast cancer ([place]) 2006   S/P chemotherapy & radiation treatment  . Dvt femoral (deep venous thrombosis) ([place]) ~ 2003   RLE  . Hepatitis B   . Hypertension   . Urinary frequency     Patient Active Problem List   Diagnosis Date Noted  . Status post total replacement of right hip 10/11/2016  . Pain in right hip 09/14/2016  . Unilateral primary osteoarthritis, right hip 09/14/2016  . HNP (herniated nucleus pulposus), lumbar 08/18/2014    Past Surgical History:  Procedure Laterality Date  . BACK SURGERY    . BREAST BIOPSY Bilateral 2006  . JOINT REPLACEMENT    . LUMBAR LAMINECTOMY/DECOMPRESSION MICRODISCECTOMY Right 08/18/2014   Procedure: RIGHT LUMBAR FOUR-FIVE, LUMBAR  FIVE-SACRAL ONE LAMINECTOMY/DECOMPRESSION MICRODISCECTOMY ;  Surgeon: Ashok Pall, MD;  Location: [facility];  Service: Neurosurgery;  Laterality: Right;  . MASTECTOMY Bilateral 2006   chemotherapy treatment  . TONSILLECTOMY    . TOTAL HIP ARTHROPLASTY Left ~ 2007  . TOTAL HIP ARTHROPLASTY Right 10/11/2016  . TOTAL HIP ARTHROPLASTY Right 10/11/2016   Procedure: RIGHT TOTAL HIP ARTHROPLASTY ANTERIOR  APPROACH;  Surgeon: Mcarthur Rossetti, MD;  Location: [facility];  Service: Orthopedics;  Laterality: Right;  . TOTAL KNEE ARTHROPLASTY Right ~ 2006   in [place]  . VARICOSE VEIN SURGERY Right    vein striping        Home Medications    Prior to Admission medications   Medication Sig Start Date End Date Taking? Authorizing Provider  acetaminophen-codeine (TYLENOL #3) 300-30 MG tablet Take 1-2 tablets by mouth every 8 (eight) hours as needed for moderate pain. 10/13/16   Mcarthur Rossetti, MD  amLODipine (NORVASC) 10 MG tablet Take 10 mg by mouth daily.    [provider]  aspirin EC 81 MG tablet Take 1 tablet (81 mg total) by mouth 2 (two) times daily after a meal. 10/13/16   Mcarthur Rossetti, MD  atorvastatin (LIPITOR) 40 MG tablet Take 40 mg by mouth every evening.     [provider]  Calcium Carbonate-Vit D-Min (CALCIUM 1200) 1200-1000 MG-UNIT CHEW Chew 1 tablet by mouth daily.    [provider]  ciprofloxacin (CIPRO) 250 MG tablet Take 1 tablet (250 mg total) by mouth 2 (two) times daily. 10/25/16   Pete Pelt, PA-C  diclofenac sodium (VOLTAREN) 1 % GEL Apply 2 g topically 2 (two) times daily as needed (pain).    [provider]  hydrochlorothiazide (HYDRODIURIL) 25 MG tablet Take 25 mg by mouth  daily.    [provider]  methocarbamol (ROBAXIN) 500 MG tablet Take 1 tablet (500 mg total) by mouth every 6 (six) hours as needed for muscle spasms. 10/13/16   Mcarthur Rossetti, MD  Multiple Vitamin (MULTIVITAMIN WITH MINERALS) TABS tablet Take 1 tablet by mouth daily.    [provider]  ondansetron (ZOFRAN ODT) 4 MG disintegrating tablet Take 1 tablet (4 mg total) by mouth every 8 (eight) hours as needed for nausea or vomiting. 10/13/16   Mcarthur Rossetti, MD  potassium chloride (KLOR-CON) 20 MEQ packet Take 20 mEq by mouth daily.    [provider]  propranolol (INDERAL) 40 MG tablet Take 40 mg  by mouth every morning.    [provider]  tamoxifen (NOLVADEX) 20 MG tablet Take 20 mg by mouth every evening.     [provider]    Family History No family history on file.  Social History Social History   Tobacco Use  . Smoking status: Never Smoker  . Smokeless tobacco: Never Used  Substance Use Topics  . Alcohol use: Yes    Alcohol/week: 0.6 oz    Types: 1 Glasses of wine per week    Comment: "sometimes 1 glass on Saturdays" (10/11/2016)  . Drug use: No     Allergies   Codeine; Morphine and related; and Tramadol   Review of Systems Review of Systems  Constitutional: Negative for chills and fever.  Respiratory: Negative for cough, chest tightness and shortness of breath.   Cardiovascular: Negative for chest pain, palpitations and leg swelling.  Gastrointestinal: Negative for abdominal distention, abdominal pain, diarrhea, nausea and vomiting.  Musculoskeletal: Negative for arthralgias, myalgias, neck pain and neck stiffness.  Skin: Positive for rash.  Neurological: Negative for dizziness, weakness, light-headedness, numbness and headaches.  All other systems reviewed and are negative.    Physical Exam Updated Vital Signs BP 134/72 (BP Location: Right Arm)   Pulse (!) 58   Temp 99 F (37.2 C) (Oral)   Resp 17   Ht 5\' 9"  (1.753 m)   Wt 104.3 kg (230 lb)   SpO2 100%   BMI 33.97 kg/m   Physical Exam  Constitutional: He appears well-developed and well-nourished. No distress.  Eyes: Conjunctivae are normal.  Neck: Neck supple.  Cardiovascular: Normal rate.  Pulmonary/Chest: No respiratory distress.  Abdominal: He exhibits no distension.  Skin: Skin is warm and dry.     Erythematous, maculopapular rash to the left upper chest wall and lower anterior neck.  No rash anywhere else on the body.  No vesicles or ulcerations.  No tenderness.  Nursing note and vitals reviewed.    ED Treatments / Results  Labs (all labs ordered are listed,  but only abnormal results are displayed) Labs Reviewed - No data to display  EKG None  Radiology No results found.  Procedures Procedures (including critical care time)  Medications Ordered in ED Medications - No data to display   Initial Impression / Assessment and Plan / ED Course  I have reviewed the triage vital signs and the nursing notes.  Pertinent labs & imaging results that were available during my care of the patient were reviewed by me and considered in my medical decision making (see chart for details).     Patient with erythematous, maculopapular rash to the anterior chest wall and lower neck.  Consistent with contact dermatitis, most likely from the [facility] he sprayed which triggered the rash.  I will place him on a  steroid cream and oral Benadryl as needed.  Follow-up with family doctor in 3 to 4 days for recheck.  Return precautions discussed.  He is otherwise afebrile, nontoxic-appearing.  Vitals:   05/18/17 1732 05/18/17 1733  BP:  134/72  Pulse:  (!) 58  Resp:  17  Temp:  99 F (37.2 C)  TempSrc:  Oral  SpO2:  100%  Weight: 104.3 kg (230 lb)   Height: 5\' 9"  (1.753 m)      Final Clinical Impressions(s) / ED Diagnoses   Final diagnoses:  Allergic contact dermatitis due to other agents    ED Discharge Orders        Ordered    triamcinolone cream (KENALOG) 0.1 %  2 times daily     05/18/17 1855    diphenhydrAMINE (BENADRYL) 25 MG tablet  Every 6 hours     05/18/17 1855       Jeannett Senior, PA-C 05/18/17 1856    Mesner, Corene Cornea, MD 05/19/17 [phone]

## 2017-05-19 ENCOUNTER — Other Ambulatory Visit: Payer: Self-pay | Admitting: Internal Medicine

## 2017-05-19 DIAGNOSIS — M8580 Other specified disorders of bone density and structure, unspecified site: Secondary | ICD-10-CM

## 2017-05-24 ENCOUNTER — Other Ambulatory Visit: Payer: Self-pay | Admitting: Internal Medicine

## 2017-05-24 DIAGNOSIS — Z79811 Long term (current) use of aromatase inhibitors: Secondary | ICD-10-CM

## 2017-06-06 ENCOUNTER — Ambulatory Visit
Admission: RE | Admit: 2017-06-06 | Discharge: 2017-06-06 | Disposition: A | Discharge status: Home / Self Care | Payer: Medicare Other | Source: Ambulatory Visit | Attending: Internal Medicine | Admitting: Internal Medicine

## 2017-06-06 DIAGNOSIS — Z79811 Long term (current) use of aromatase inhibitors: Secondary | ICD-10-CM

## 2017-07-17 ENCOUNTER — Encounter (HOSPITAL_COMMUNITY): Payer: Self-pay | Admitting: *Deleted

## 2017-07-17 ENCOUNTER — Other Ambulatory Visit: Payer: Self-pay

## 2017-07-17 ENCOUNTER — Emergency Department (HOSPITAL_COMMUNITY)
Admission: EM | Admit: 2017-07-17 | Discharge: 2017-07-17 | Disposition: A | Discharge status: Home / Self Care | Payer: Medicare Other | Attending: Emergency Medicine | Admitting: Emergency Medicine

## 2017-07-17 DIAGNOSIS — Z96643 Presence of artificial hip joint, bilateral: Secondary | ICD-10-CM | POA: Insufficient documentation

## 2017-07-17 DIAGNOSIS — Z96651 Presence of right artificial knee joint: Secondary | ICD-10-CM | POA: Diagnosis not present

## 2017-07-17 DIAGNOSIS — I10 Essential (primary) hypertension: Secondary | ICD-10-CM | POA: Diagnosis not present

## 2017-07-17 DIAGNOSIS — L282 Other prurigo: Secondary | ICD-10-CM | POA: Diagnosis not present

## 2017-07-17 DIAGNOSIS — Z853 Personal history of malignant neoplasm of breast: Secondary | ICD-10-CM | POA: Insufficient documentation

## 2017-07-17 DIAGNOSIS — Z79899 Other long term (current) drug therapy: Secondary | ICD-10-CM | POA: Diagnosis not present

## 2017-07-17 DIAGNOSIS — Z7982 Long term (current) use of aspirin: Secondary | ICD-10-CM | POA: Diagnosis not present

## 2017-07-17 DIAGNOSIS — R21 Rash and other nonspecific skin eruption: Secondary | ICD-10-CM | POA: Diagnosis present

## 2017-07-17 MED ORDER — HYDROXYZINE HCL 25 MG PO TABS
25.0000 mg | ORAL_TABLET | Freq: Once | ORAL | Status: AC
  Administered 2017-07-17: 25 mg via ORAL
  Filled 2017-07-17: qty 1

## 2017-07-17 MED ORDER — HYDROXYZINE HCL 25 MG PO TABS: 25.0000 mg | ORAL_TABLET | Freq: Four times a day (QID) | ORAL | 0 refills | Status: AC

## 2017-07-17 MED ORDER — HYDROCORTISONE 2.5 % EX LOTN: TOPICAL_LOTION | Freq: Two times a day (BID) | CUTANEOUS | 0 refills | Status: AC

## 2017-07-17 NOTE — ED Provider Notes (Signed)
Patient placed in Quick Look pathway, seen and evaluated   Chief Complaint: rash and itching  HPI:   Fawaz Borquez is a 77 y.o. male who presents to the ED with a rash and itching. Patient used Kenalog and Benadryl. Patient states he need Rx that benadryl is not helping.   ROS: skin: rash, itching  Physical Exam:  BP (!) 163/75 (BP Location: Right Arm)   Pulse 63   Temp 98.5 F (36.9 C) (Oral)   Resp 18   SpO2 97%    Gen: No distress  Neuro: Awake and Alert  Skin: Warm and dry, rash to upper chest area.       Initiation of care has begun. The patient has been counseled on the process, plan, and necessity for staying for the completion/evaluation, and the remainder of the medical screening examination    Ashley Murrain, NP 07/17/17 [place], [place], DO 07/17/17 2309

## 2017-07-17 NOTE — ED Provider Notes (Signed)
[facility] EMERGENCY DEPARTMENT Provider Note   CSN: [id number] Arrival date & time: 07/17/17  1539     History   Chief Complaint Chief Complaint  Patient presents with  . Rash    HPI Angel Fox is a 77 y.o. male.  Angel Fox is a 77 y.o. Male with a history of bilateral breast cancer s/p chemotherapy, radiation and bilateral mastectomy, hypertension, DVT and arthritis, who presents to the emergency department for evaluation of pruritic rash over upper chest and neck.  Patient was seen for similar back in April and it was thought to be allergic dermatitis from patient's new cologne.  He reports some improvement in the rash but still having itching.  Patient was prescribed Kenalog and Benadryl which he has used, but is requesting something prescription for the itching and rash.  He has not followed up with his primary care doctor or his oncologist regarding this.  Patient requesting chest x-ray because he wants to know if his cancer is back, denies chest pain or shortness of breath.  No facial swelling or difficulty breathing.  Rash has not spread to any new locations.     Past Medical History:  Diagnosis Date  . Arthritis    "right leg" (10/11/2016)  . Bilateral breast cancer ([place]) 2006   S/P chemotherapy & radiation treatment  . 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 Hepatitis B   . Hypertension   . Urinary frequency     Patient Active Problem List   Diagnosis Date Noted  . Status post total replacement of right hip 10/11/2016  . Pain in right hip 09/14/2016  . Unilateral primary osteoarthritis, right hip 09/14/2016  . HNP (herniated nucleus pulposus), lumbar 08/18/2014    Past Surgical History:  Procedure Laterality Date  . BACK SURGERY    . BREAST BIOPSY Bilateral 2006  . JOINT REPLACEMENT    . LUMBAR LAMINECTOMY/DECOMPRESSION MICRODISCECTOMY Right 08/18/2014   Procedure: RIGHT LUMBAR FOUR-FIVE, LUMBAR  FIVE-SACRAL ONE  LAMINECTOMY/DECOMPRESSION MICRODISCECTOMY ;  Surgeon: Ashok Pall, MD;  Location: [facility];  Service: Neurosurgery;  Laterality: Right;  . MASTECTOMY Bilateral 2006   chemotherapy treatment  . TONSILLECTOMY    . TOTAL HIP ARTHROPLASTY Left ~ 2007  . TOTAL HIP ARTHROPLASTY Right 10/11/2016  . TOTAL HIP ARTHROPLASTY Right 10/11/2016   Procedure: RIGHT TOTAL HIP ARTHROPLASTY ANTERIOR APPROACH;  Surgeon: Mcarthur Rossetti, MD;  Location: [facility];  Service: Orthopedics;  Laterality: Right;  . TOTAL KNEE ARTHROPLASTY Right ~ 2006   in [place]  . VARICOSE VEIN SURGERY Right    vein striping        Home Medications    Prior to Admission medications   Medication Sig Start Date End Date Taking? Authorizing Provider  acetaminophen-codeine (TYLENOL #3) 300-30 MG tablet Take 1-2 tablets by mouth every 8 (eight) hours as needed for moderate pain. 10/13/16   Mcarthur Rossetti, MD  amLODipine (NORVASC) 10 MG tablet Take 10 mg by mouth daily.    [provider]  aspirin EC 81 MG tablet Take 1 tablet (81 mg total) by mouth 2 (two) times daily after a meal. 10/13/16   Mcarthur Rossetti, MD  atorvastatin (LIPITOR) 40 MG tablet Take 40 mg by mouth every evening.     [provider]  Calcium Carbonate-Vit D-Min (CALCIUM 1200) 1200-1000 MG-UNIT CHEW Chew 1 tablet by mouth daily.    [provider]  ciprofloxacin (CIPRO) 250 MG tablet  Take 1 tablet (250 mg total) by mouth 2 (two) times daily. 10/25/16   Pete Pelt, PA-C  diclofenac sodium (VOLTAREN) 1 % GEL Apply 2 g topically 2 (two) times daily as needed (pain).    [provider]  diphenhydrAMINE (BENADRYL) 25 MG tablet Take 1 tablet (25 mg total) by mouth every 6 (six) hours. 05/18/17   Kirichenko, Tatyana, PA-C  hydrochlorothiazide (HYDRODIURIL) 25 MG tablet Take 25 mg by mouth daily.    [provider]  hydrocortisone 2.5 % lotion Apply topically 2 (two) times daily. 07/17/17   Jacqlyn Larsen, PA-C  hydrOXYzine (ATARAX/VISTARIL) 25 MG tablet Take 1 tablet (25 mg total) by mouth every 6 (six) hours. 07/17/17   Jacqlyn Larsen, PA-C  methocarbamol (ROBAXIN) 500 MG tablet Take 1 tablet (500 mg total) by mouth every 6 (six) hours as needed for muscle spasms. 10/13/16   Mcarthur Rossetti, MD  Multiple Vitamin (MULTIVITAMIN WITH MINERALS) TABS tablet Take 1 tablet by mouth daily.    [provider]  ondansetron (ZOFRAN ODT) 4 MG disintegrating tablet Take 1 tablet (4 mg total) by mouth every 8 (eight) hours as needed for nausea or vomiting. 10/13/16   Mcarthur Rossetti, MD  potassium chloride (KLOR-CON) 20 MEQ packet Take 20 mEq by mouth daily.    [provider]  propranolol (INDERAL) 40 MG tablet Take 40 mg by mouth every morning.    [provider]  tamoxifen (NOLVADEX) 20 MG tablet Take 20 mg by mouth every evening.     [provider]  triamcinolone cream (KENALOG) 0.1 % Apply 1 application topically 2 (two) times daily. 05/18/17   Jeannett Senior, PA-C    Family History History reviewed. No pertinent family history.  Social History Social History   Tobacco Use  . Smoking status: Never Smoker  . Smokeless tobacco: Never Used  Substance Use Topics  . Alcohol use: Yes    Alcohol/week: 0.6 oz    Types: 1 Glasses of wine per week    Comment: "sometimes 1 glass on Saturdays" (10/11/2016)  . Drug use: No     Allergies   Codeine; Morphine and related; and Tramadol   Review of Systems Review of Systems  Constitutional: Negative for chills and fever.  HENT: Negative for facial swelling.   Respiratory: Negative for shortness of breath and stridor.   Cardiovascular: Negative for chest pain.  Skin: Positive for rash.     Physical Exam Updated Vital Signs BP (!) 143/95 (BP Location: Right Arm)   Pulse 64   Temp 98.5 F (36.9 C) (Oral)   Resp 16   SpO2 100%   Physical Exam  Constitutional: He appears  well-developed and well-nourished. No distress.  HENT:  Head: Normocephalic and atraumatic.  Eyes: Right eye exhibits no discharge. Left eye exhibits no discharge.  Cardiovascular: Normal rate, regular rhythm, normal heart sounds and intact distal pulses.  Pulmonary/Chest: Effort normal and breath sounds normal. No stridor. No respiratory distress. He has no wheezes. He has no rales.  Prior scars from mastectomy noted, no erythema or swelling surrounding these  Neurological: He is alert. Coordination normal.  Skin: Skin is warm and dry. Rash noted. He is not diaphoretic.  Dry faint rash noted over the upper chest and neck, no pustules, vesicles or petechiae, no surrounding erythema or induration to suggest cellulitis  Psychiatric: He has a normal mood and affect. His behavior is normal.  Nursing note and vitals reviewed.    ED  Treatments / Results  Labs (all labs ordered are listed, but only abnormal results are displayed) Labs Reviewed - No data to display  EKG None  Radiology No results found.  Procedures Procedures (including critical care time)  Medications Ordered in ED Medications  hydrOXYzine (ATARAX/VISTARIL) tablet 25 mg (25 mg Oral Given 07/17/17 1910)     Initial Impression / Assessment and Plan / ED Course  I have reviewed the triage vital signs and the nursing notes.  Pertinent labs & imaging results that were available during my care of the patient were reviewed by me and considered in my medical decision making (see chart for details).  Patient presents for evaluation of rash, requesting prescription medication to help with itching and rash.. Patient denies any difficulty breathing or swallowing.  Pt has a patent airway without stridor and is handling secretions without difficulty; no angioedema. No blisters, no pustules, no warmth, no draining sinus tracts, no superficial abscesses, no bullous impetigo, no vesicles, no desquamation, no target lesions with dusky  purpura or a central bulla. Not tender to touch. No concern for superimposed infection. No concern for SJS, TEN, TSS, tick borne illness, syphilis or other life-threatening condition. Will discharge home prescription for hydroxyzine and hydrocortisone lotion.  Stressed to patient's the importance of following up with his primary care doctor and oncologist.  He expresses understanding and is in agreement with plan.    Final Clinical Impressions(s) / ED Diagnoses   Final diagnoses:  Pruritic rash    ED Discharge Orders        Ordered    hydrOXYzine (ATARAX/VISTARIL) 25 MG tablet  Every 6 hours     07/17/17 1910    hydrocortisone 2.5 % lotion  2 times daily     07/17/17 1910       Jacqlyn Larsen, [place] 07/18/17 0243    Isla Pence, MD 07/20/17 1201

## 2017-07-17 NOTE — ED Triage Notes (Signed)
Pt has hx breast cancer. Reports irritation of incision sites and itching to his chest and arms. Has rash noted to upper chest and arms.

## 2017-07-17 NOTE — Discharge Instructions (Addendum)
You may use hydroxyzine as needed for itching and hydrocortisone lotion.  It is extremely important that you follow-up with your regular doctor and your oncologist for continued evaluation if rash persists.  Return for fevers, significant worsening of rash or any other new or concerning symptoms.

## 2017-12-18 IMAGING — RF DG C-ARM 61-120 MIN
1 series · 2 of 2 positions shown · non-contrast
Comparison: Pelvis radiographs 09/14/2016

FLUOROSCOPY TIME:  0 minutes 38 seconds

CLINICAL DATA: 76-year-old male status post total hip arthroplasty
with anterior approach on the right.

EXAM:
OPERATIVE RIGHT HIP (WITH PELVIS IF PERFORMED) 2 VIEWS
TECHNIQUE: Fluoroscopic spot image(s) were submitted for interpretation
post-operatively.

[Series 1: run · 2 of 2 slices shown]
[im 1/2]
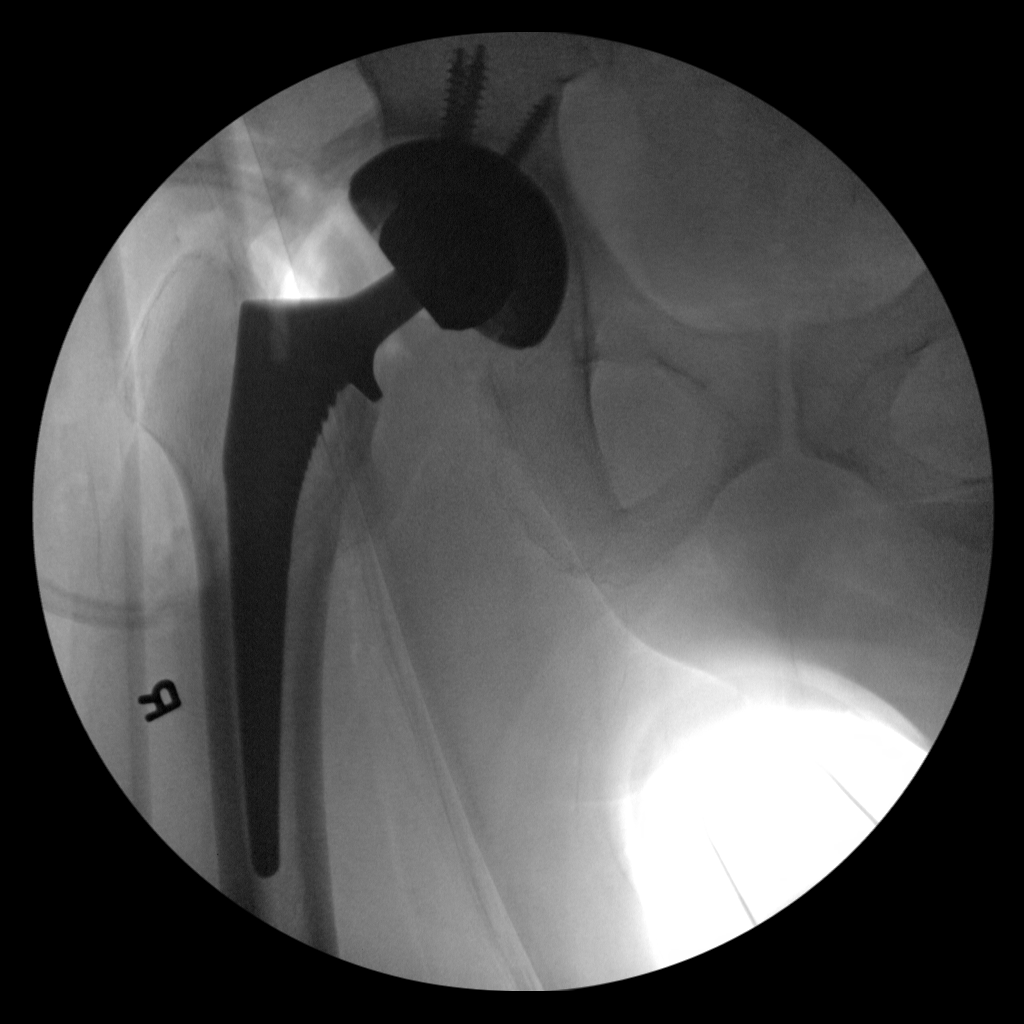
[im 2/2]
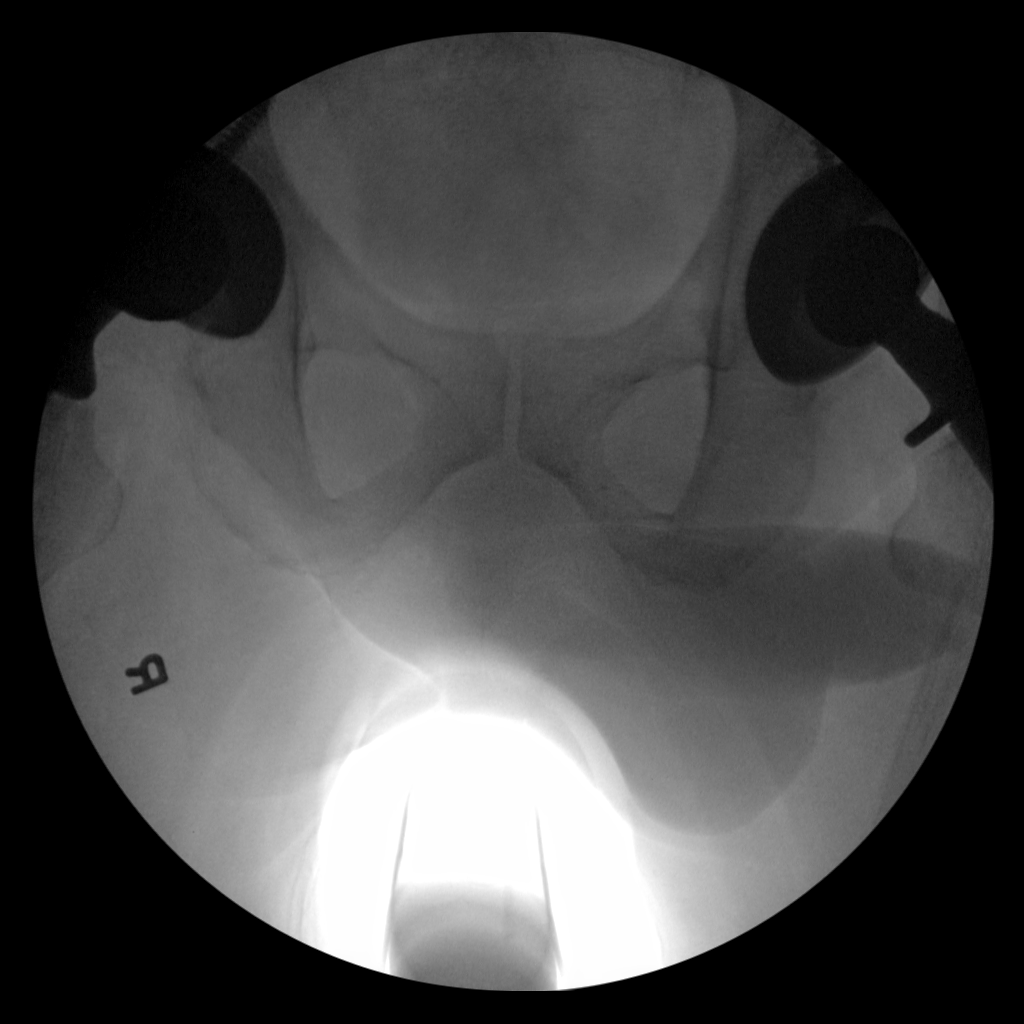

[2 of 2 positions shown; findings below may reference images not displayed]

FINDINGS: 2 intraoperative fluoroscopic spot views of the lower pelvis and
right hip in the AP projection. New right total hip arthroplasty
hardware appears intact and normally aligned in the AP plane.
Preexisting left total hip arthroplasty.
IMPRESSION: New right total hip arthroplasty.  No adverse features identified.

## 2020-04-15 ENCOUNTER — Other Ambulatory Visit: Payer: Self-pay | Admitting: Internal Medicine

## 2020-04-16 LAB — COMPLETE METABOLIC PANEL WITH GFR
AG Ratio: 1.4 (calc) (ref 1.0–2.5)
ALT: 16 U/L (ref 9–46)
AST: 20 U/L (ref 10–35)
Albumin: 4 g/dL (ref 3.6–5.1)
Alkaline phosphatase (APISO): 62 U/L (ref 35–144)
BUN/Creatinine Ratio: 10 (calc) (ref 6–22)
BUN: 11 mg/dL (ref 7–25)
CO2: 26 mmol/L (ref 20–32)
Calcium: 9.4 mg/dL (ref 8.6–10.3)
Chloride: 100 mmol/L (ref 98–110)
Creat: 1.14 mg/dL — ABNORMAL HIGH (ref 0.70–1.11)
GFR, Est African American: 70 mL/min/{1.73_m2} (ref >=60)
GFR, Est Non African American: 60 mL/min/{1.73_m2} (ref >=60)
Globulin: 2.9 g/dL (calc) (ref 1.9–3.7)
Glucose, Bld: 125 mg/dL — ABNORMAL HIGH (ref 65–99)
Potassium: 4.4 mmol/L (ref 3.5–5.3)
Sodium: 135 mmol/L (ref 135–146)
Total Bilirubin: 0.5 mg/dL (ref 0.2–1.2)
Total Protein: 6.9 g/dL (ref 6.1–8.1)

## 2020-04-16 LAB — CBC
HCT: 39.3 % (ref 38.5–50.0)
Hemoglobin: 13.1 g/dL — ABNORMAL LOW (ref 13.2–17.1)
MCH: 30.3 pg (ref 27.0–33.0)
MCHC: 33.3 g/dL (ref 32.0–36.0)
MCV: 90.8 fL (ref 80.0–100.0)
MPV: 11.7 fL (ref 7.5–12.5)
Platelets: 181 10*3/uL (ref 140–400)
RBC: 4.33 10*6/uL (ref 4.20–5.80)
RDW: 13.6 % (ref 11.0–15.0)
WBC: 6.4 10*3/uL (ref 3.8–10.8)

## 2020-04-16 LAB — LIPID PANEL
Cholesterol: 145 mg/dL (ref <200)
HDL: 40 mg/dL (ref >=40)
LDL Cholesterol (Calc): 86 mg/dL (calc)
Non-HDL Cholesterol (Calc): 105 mg/dL (calc) (ref <130)
Total CHOL/HDL Ratio: 3.6 (calc) (ref <5.0)
Triglycerides: 93 mg/dL (ref <150)

## 2020-04-16 LAB — TSH: TSH: 0.58 mIU/L (ref 0.40–4.50)

## 2020-11-16 ENCOUNTER — Other Ambulatory Visit: Payer: Self-pay | Admitting: Internal Medicine

## 2020-11-16 DIAGNOSIS — E2839 Other primary ovarian failure: Secondary | ICD-10-CM

## 2021-02-18 ENCOUNTER — Other Ambulatory Visit: Payer: Self-pay

## 2021-02-18 ENCOUNTER — Encounter: Payer: Self-pay | Admitting: Podiatry

## 2021-02-18 ENCOUNTER — Ambulatory Visit (INDEPENDENT_AMBULATORY_CARE_PROVIDER_SITE_OTHER): Payer: Medicare Other | Admitting: Podiatry

## 2021-02-18 DIAGNOSIS — M79675 Pain in left toe(s): Secondary | ICD-10-CM

## 2021-02-18 DIAGNOSIS — M79674 Pain in right toe(s): Secondary | ICD-10-CM

## 2021-02-18 DIAGNOSIS — B351 Tinea unguium: Secondary | ICD-10-CM

## 2021-02-18 DIAGNOSIS — G629 Polyneuropathy, unspecified: Secondary | ICD-10-CM

## 2021-02-18 NOTE — Progress Notes (Signed)
This patient returns to the office for evaluation and treatment of long thick painful  big nails both feet..  This patient is unable to trim his own nails since the patient cannot reach his feet.  Patient says the nails are painful walking and wearing his shoes.  He returns for preventive foot care services.  General Appearance  Alert, conversant and in no acute stress.  Vascular  Dorsalis pedis and posterior tibial  pulses are palpable  bilaterally.  Capillary return is within normal limits  bilaterally. Temperature is within normal limits  bilaterally.  Neurologic  Senn-Weinstein monofilament wire test absent  bilaterally. Muscle power within normal limits bilaterally.  Nails Thick disfigured discolored nails with subungual debris  hallux  nails bilaterally. No evidence of bacterial infection or drainage bilaterally.  Orthopedic  No limitations of motion  feet .  No crepitus or effusions noted.  No bony pathology or digital deformities noted.  Skin  normotropic skin with no porokeratosis noted bilaterally.  No signs of infections or ulcers noted.     Onychomycosis  Pain in toes right foot  Pain in toes left foot  Debridement  of nails  1-5  B/L with a nail nipper.  Nails were then filed using a dremel tool with no incidents. Patient was interested in permanent removal of hallux toenail and I offered to schedule im with one of the surgical doctors.  He said he would think about it.   RTC   3 months  Gardiner Barefoot DPM

## 2021-04-22 ENCOUNTER — Ambulatory Visit
Admission: RE | Admit: 2021-04-22 | Discharge: 2021-04-22 | Disposition: A | Discharge status: Home / Self Care | Payer: Medicare Other | Source: Ambulatory Visit | Attending: Internal Medicine | Admitting: Internal Medicine

## 2021-04-22 DIAGNOSIS — E2839 Other primary ovarian failure: Secondary | ICD-10-CM

## 2021-05-24 ENCOUNTER — Ambulatory Visit: Payer: Medicare Other | Admitting: Podiatry

## 2022-05-05 ENCOUNTER — Other Ambulatory Visit: Payer: Self-pay | Admitting: Internal Medicine

## 2022-05-06 LAB — CBC
HCT: 42.9 % (ref 38.5–50.0)
Hemoglobin: 14.3 g/dL (ref 13.2–17.1)
MCH: 29.8 pg (ref 27.0–33.0)
MCHC: 33.3 g/dL (ref 32.0–36.0)
MCV: 89.4 fL (ref 80.0–100.0)
MPV: 11.4 fL (ref 7.5–12.5)
Platelets: 188 10*3/uL (ref 140–400)
RBC: 4.8 10*6/uL (ref 4.20–5.80)
RDW: 13.9 % (ref 11.0–15.0)
WBC: 6.5 10*3/uL (ref 3.8–10.8)

## 2022-05-06 LAB — COMPLETE METABOLIC PANEL WITH GFR
AG Ratio: 1.3 (calc) (ref 1.0–2.5)
ALT: 16 U/L (ref 9–46)
AST: 19 U/L (ref 10–35)
Albumin: 4 g/dL (ref 3.6–5.1)
Alkaline phosphatase (APISO): 81 U/L (ref 35–144)
BUN/Creatinine Ratio: 10 (calc) (ref 6–22)
BUN: 12 mg/dL (ref 7–25)
CO2: 25 mmol/L (ref 20–32)
Calcium: 9.5 mg/dL (ref 8.6–10.3)
Chloride: 99 mmol/L (ref 98–110)
Creat: 1.26 mg/dL — ABNORMAL HIGH (ref 0.70–1.22)
Globulin: 3.1 g/dL (calc) (ref 1.9–3.7)
Glucose, Bld: 111 mg/dL — ABNORMAL HIGH (ref 65–99)
Potassium: 4 mmol/L (ref 3.5–5.3)
Sodium: 135 mmol/L (ref 135–146)
Total Bilirubin: 0.6 mg/dL (ref 0.2–1.2)
Total Protein: 7.1 g/dL (ref 6.1–8.1)
eGFR: 57 mL/min/{1.73_m2} — ABNORMAL LOW (ref >=60)

## 2022-05-06 LAB — LIPID PANEL
Cholesterol: 147 mg/dL (ref <200)
HDL: 46 mg/dL (ref >=40)
LDL Cholesterol (Calc): 85 mg/dL (calc)
Non-HDL Cholesterol (Calc): 101 mg/dL (calc) (ref <130)
Total CHOL/HDL Ratio: 3.2 (calc) (ref <5.0)
Triglycerides: 70 mg/dL (ref <150)

## 2022-05-06 LAB — TSH: TSH: 1.28 mIU/L (ref 0.40–4.50)
